# Patient Record
Sex: Male | Born: 1950 | Race: White | Hispanic: No | Marital: Married
Health system: Southern US, Community
[De-identification: ages and names within clinical notes are randomized; demographics above are authoritative.]

## PROBLEM LIST (undated history)

## (undated) DIAGNOSIS — M1990 Unspecified osteoarthritis, unspecified site: Secondary | ICD-10-CM

## (undated) DIAGNOSIS — Q249 Congenital malformation of heart, unspecified: Secondary | ICD-10-CM

## (undated) DIAGNOSIS — K219 Gastro-esophageal reflux disease without esophagitis: Secondary | ICD-10-CM

## (undated) DIAGNOSIS — I10 Essential (primary) hypertension: Secondary | ICD-10-CM

## (undated) HISTORY — DX: Gastro-esophageal reflux disease without esophagitis: K21.9

## (undated) HISTORY — DX: Congenital malformation of heart, unspecified: Q24.9

## (undated) HISTORY — DX: Unspecified osteoarthritis, unspecified site: M19.90

## (undated) HISTORY — DX: Essential (primary) hypertension: I10

---

## 2003-12-12 HISTORY — PX: KNEE SURGERY: SHX244

## 2007-12-12 HISTORY — PX: SHOULDER SURGERY: SHX246

## 2013-12-11 HISTORY — PX: PROSTATE SURGERY: SHX751

## 2018-10-11 NOTE — Progress Notes (Signed)
[facility] [address] [place], [place] [address] Phone: [phone] Subjective:    I Jay Morse am serving as a Neurosurgeon for Dr. Antoine Morse.   CC: Right knee pain  BJY:[id number]  Jay Morse is a 67 y.o. male coming in with complaint of right knee pain. No numbness and tingling noted. Pain radiates to the mid calf. History of right knee surgery 2005.  Onset- 20 years  Location- lateral Duration-  Character- Achy Aggravating factors- Tennis Reliving factors- Ice, heat, topicals, oral Therapies tried-  Severity-7 out of 10     Past Medical History:  Diagnosis Date  . Arthritis   . 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 GERD (gastroesophageal reflux disease)   . High blood pressure    Past Surgical History:  Procedure Laterality Date  . KNEE SURGERY Right 2005  . PROSTATE SURGERY  2015  . SHOULDER SURGERY Right 2009   Social History   Socioeconomic History  . Marital status: Married    Spouse name: Not on file  . Number of children: 2  . Years of education: Not on file  . Highest education level: Not on file  Occupational History  . Not on file  Social Needs  . Financial resource strain: Not on file  . Food insecurity:    Worry: Not on file    Inability: Not on file  . Transportation needs:    Medical: Not on file    Non-medical: Not on file  Tobacco Use  . Smoking status: Not on file  Substance and Sexual Activity  . Alcohol use: Not on file  . Drug use: Not on file  . Sexual activity: Yes  Lifestyle  . Physical activity:    Days per week: Not on file    Minutes per session: Not on file  . Stress: Not on file  Relationships  . Social connections:    Talks on phone: Not on file    Gets together: Not on file    Attends religious service: Not on file    Active member of club or organization: Not on file    Attends meetings of clubs or organizations: Not on file    Relationship status: Not on  file  Other Topics Concern  . Not on file  Social History Narrative  . Not on file   Not on File Family History  Problem Relation Age of Onset  . Hearing loss Mother   . Hypertension Mother   . Diabetes Father   . Hearing loss Father   . Hypertension Father      Current Outpatient Medications (Cardiovascular):  .  metoprolol tartrate (LOPRESSOR) 50 MG tablet, Take 50 mg by mouth daily.     Current Outpatient Medications (Other):  .  ranitidine (ZANTAC) 300 MG tablet, Take 300 mg by mouth at bedtime. .  Vitamin D, Ergocalciferol, (DRISDOL) 50000 units CAPS capsule, Take 1 capsule (50,000 Units total) by mouth every 7 (seven) days.    Past medical history, social, surgical and family history all reviewed in electronic medical record.  No pertanent information unless stated regarding to the chief complaint.   Review of Systems:  No headache, visual changes, nausea, vomiting, diarrhea, constipation, dizziness, abdominal pain, skin rash, fevers, chills, night sweats, weight loss, swollen lymph nodes, body aches, joint swelling, chest pain, shortness of breath, mood changes.  Positive muscle aches  Objective  Blood pressure 110/82, pulse (!) 56, height 6\' 4"  (  1.93 m), weight 233 lb (105.7 kg), SpO2 93 %.    General: No apparent distress alert and oriented x3 mood and affect normal, dressed appropriately.  HEENT: Pupils equal, extraocular movements intact  Respiratory: Patient's speak in full sentences and does not appear short of breath  Cardiovascular: No lower extremity edema, non tender, no erythema  Skin: Warm dry intact with no signs of infection or rash on extremities or on axial skeleton.  Abdomen: Soft nontender  Neuro: Cranial nerves II through XII are intact, neurovascularly intact in all extremities with 2+ DTRs and 2+ pulses.  Lymph: No lymphadenopathy of posterior or anterior cervical chain or axillae bilaterally.  Gait antalgic.  MSK:  Non tender with full range  of motion and good stability and symmetric strength and tone of shoulders, elbows, wrist, hip, and ankles bilaterally.  Knee: Right Varus deformity noted.  Normal thigh to calf ratio.  Tender to palpation over lateral and PF joint line.  ROM full in flexion and extension and lower leg rotation. instability with varus force.  painful patellar compression. Patellar glide with moderate crepitus. Patellar and quadriceps tendons unremarkable. Hamstring and quadriceps strength is normal. Contralateral knee shows mild bowing but no pain  Back Exam:  Inspection: Severe loss of lordosis Motion: Flexion 45 deg, Extension 25 deg, Side Bending to 35 deg bilaterally,  Rotation to 35 deg bilaterally  SLR laying: Negative  XSLR laying: Negative  Palpable tenderness: Tender to palpation.Marland Kitchen FABER: Tightness bilaterally. Sensory change: Gross sensation intact to all lumbar and sacral dermatomes.  Reflexes: 2+ at both patellar tendons, 2+ at achilles tendons, Babinski's downgoing.  Strength at foot  Plantar-flexion: 5/5 Dorsi-flexion: 5/5 Eversion: 5/5 Inversion: 5/5  Leg strength  Quad: 5/5 Hamstring: 5/5 Hip flexor: 5/5 Hip abductors: 4/5 but symmetric  MSK US performed of: Knee This study was ordered, performed, and interpreted by Jay Morse D.O.  Knee: Postsurgical changes of the lateral meniscus noted with moderate to severe osteoarthritic changes.  Patient does have injury of the LCL.  IMPRESSION: Lateral compartment arthritis   [address]10; 15 additional minutes spent for Therapeutic exercises as stated in above notes.  This included exercises focusing on stretching, strengthening, with significant focus on eccentric aspects.   Long term goals include an improvement in range of motion, strength, endurance as well as avoiding reinjury. Patient's frequency would include in 1-2 times a day, 3-5 times a week for a duration of 6-12 weeks. Low back exercises that included:  Pelvic tilt/bracing  instruction to focus on control of the pelvic girdle and lower abdominal muscles  Glute strengthening exercises, focusing on proper firing of the glutes without engaging the low back muscles Proper stretching techniques for maximum relief for the hamstrings, hip flexors, low back and some rotation where tolerated    Proper technique shown and discussed handout in great detail with ATC.  All questions were discussed and answered.     Impression and Recommendations:     This case required medical decision making of moderate complexity. The above documentation has been reviewed and is accurate and complete Jay Saa, DO       Note: This dictation was prepared with Dragon dictation along with smaller phrase technology. Any transcriptional errors that result from this process are unintentional.

## 2018-10-14 ENCOUNTER — Encounter: Payer: Self-pay | Admitting: Family Medicine

## 2018-10-14 ENCOUNTER — Ambulatory Visit (INDEPENDENT_AMBULATORY_CARE_PROVIDER_SITE_OTHER)
Admission: RE | Admit: 2018-10-14 | Discharge: 2018-10-14 | Disposition: A | Discharge status: Home / Self Care | Payer: Medicare Other | Source: Ambulatory Visit | Attending: Family Medicine | Admitting: Family Medicine

## 2018-10-14 ENCOUNTER — Ambulatory Visit (INDEPENDENT_AMBULATORY_CARE_PROVIDER_SITE_OTHER): Payer: Medicare Other | Admitting: Family Medicine

## 2018-10-14 ENCOUNTER — Ambulatory Visit: Payer: Self-pay

## 2018-10-14 VITALS — BP 110/82 | HR 56 | Ht 76.0 in | Wt 233.0 lb

## 2018-10-14 DIAGNOSIS — M13861 Other specified arthritis, right knee: Secondary | ICD-10-CM | POA: Diagnosis not present

## 2018-10-14 DIAGNOSIS — M25561 Pain in right knee: Principal | ICD-10-CM

## 2018-10-14 DIAGNOSIS — M549 Dorsalgia, unspecified: Secondary | ICD-10-CM

## 2018-10-14 DIAGNOSIS — M48061 Spinal stenosis, lumbar region without neurogenic claudication: Secondary | ICD-10-CM | POA: Diagnosis not present

## 2018-10-14 DIAGNOSIS — G8929 Other chronic pain: Secondary | ICD-10-CM

## 2018-10-14 MED ORDER — VITAMIN D (ERGOCALCIFEROL) 1.25 MG (50000 UNIT) PO CAPS: 50000.0000 [IU] | ORAL_CAPSULE | ORAL | 0 refills | Status: DC

## 2018-10-14 NOTE — Patient Instructions (Addendum)
Good to meet you  Ice 20 minutes 2 times daily. Usually after activity and before bed. pennsaid pinkie amount topically 2 times daily as needed.  Exercises 3 times a week.  Once weekly vitamin D for 12 weeks Xrays downstairs Turmeric 500mg  daily  Fish oil 2 grams daily  They will call you on the brace See me again in 4-6weeks and we will make sure making progress

## 2018-10-14 NOTE — Assessment & Plan Note (Signed)
Degenerative spinal stenosis.  Discussed icing regimen and home exercises.  Discussed which activities to do

## 2018-10-14 NOTE — Assessment & Plan Note (Signed)
Lateral compartment.  We will get an unloader brace with patient having fairly specific arthritis.  Seems to be doing better with some mild instability and abnormal thigh to calf ratio.  Custom bracing will be necessary.  Discussed icing regimen and home exercise.  Discussed which activities doing which wants to avoid.  Patient will try topical anti-inflammatories.  Follow-up again in 4 to 6 weeks

## 2018-11-25 NOTE — Progress Notes (Signed)
Tawana ScaleZach Morse D.O. [facility] [address] [place], [place] [address] Phone: [phone] Subjective:   Jay Morse, Jay Morse, am serving as a scribe for Dr. Antoine PrimasZachary Morse.    CC: Knee and back pain  NFA:[id number]:Subjective  Jay SpikesJohn Morse is a 67 y.o. male coming in with complaint of knee and back pain. Patient has brace and has played tennis twice while wearing the brace. More stability patient has been using the brace more regularly feels like it is doing well.  Known arthritic changes  Patient has been traveling but began yoga and feels that his pain is decreasing.overall feeling decent. Noting severe.  Patient feels tightness overall but nothing severe.      Past Medical History:  Diagnosis Date  . Arthritis   . 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 GERD (gastroesophageal reflux disease)   . High blood pressure    Past Surgical History:  Procedure Laterality Date  . KNEE SURGERY Right 2005  . PROSTATE SURGERY  2015  . SHOULDER SURGERY Right 2009   Social History   Socioeconomic History  . Marital status: Married    Spouse name: Not on file  . Number of children: 2  . Years of education: Not on file  . Highest education level: Not on file  Occupational History  . Not on file  Social Needs  . Financial resource strain: Not on file  . Food insecurity:    Worry: Not on file    Inability: Not on file  . Transportation needs:    Medical: Not on file    Non-medical: Not on file  Tobacco Use  . Smoking status: Not on file  Substance and Sexual Activity  . Alcohol use: Not on file  . Drug use: Not on file  . Sexual activity: Yes  Lifestyle  . Physical activity:    Days per week: Not on file    Minutes per session: Not on file  . Stress: Not on file  Relationships  . Social connections:    Talks on phone: Not on file    Gets together: Not on file    Attends religious service: Not on file    Active member of club or organization: Not on file   Attends meetings of clubs or organizations: Not on file    Relationship status: Not on file  Other Topics Concern  . Not on file  Social History Narrative  . Not on file   Not on File Family History  Problem Relation Age of Onset  . Hearing loss Mother   . Hypertension Mother   . Diabetes Father   . Hearing loss Father   . Hypertension Father      Current Outpatient Medications (Cardiovascular):  .  metoprolol tartrate (LOPRESSOR) 50 MG tablet, Take 50 mg by mouth daily.     Current Outpatient Medications (Other):  .  ranitidine (ZANTAC) 300 MG tablet, Take 300 mg by mouth at bedtime. .  Vitamin D, Ergocalciferol, (DRISDOL) 50000 units CAPS capsule, Take 1 capsule (50,000 Units total) by mouth every 7 (seven) days.    Past medical history, social, surgical and family history all reviewed in electronic medical record.  No pertanent information unless stated regarding to the chief complaint.   Review of Systems:  No headache, visual changes, nausea, vomiting, diarrhea, constipation, dizziness, abdominal pain, skin rash, fevers, chills, night sweats, weight loss, swollen lymph nodes, body aches, joint swelling, muscle aches, chest pain, shortness of breath,  mood changes.   Objective  There were no vitals taken for this visit. Systems examined below as of    General: No apparent distress alert and oriented x3 mood and affect normal, dressed appropriately.  HEENT: Pupils equal, extraocular movements intact  Respiratory: Patient's speak in full sentences and does not appear short of breath  Cardiovascular: No lower extremity edema, non tender, no erythema  Skin: Warm dry intact with no signs of infection or rash on extremities or on axial skeleton.  Abdomen: Soft nontender  Neuro: Cranial nerves II through XII are intact, neurovascularly intact in all extremities with 2+ DTRs and 2+ pulses.  Lymph: No lymphadenopathy of posterior or anterior cervical chain or axillae  bilaterally.  Gait normal with good balance and coordination.  MSK:  Non tender with full range of motion and good stability and symmetric strength and tone of shoulders, elbows, wrist, hip, and ankles bilaterally.  Knee: Right Varus deformity noted. Large thigh to calf ratio.  Tender to palpation over medial and PF joint line.  ROM full in flexion and extension and lower leg rotation. instability with valgus force.  painful patellar compression. Patellar glide with moderate crepitus. Patellar and quadriceps tendons unremarkable. Hamstring and quadriceps strength is normal. Contralateral knee shows mild arthritic changes  Patient's back exam has loss of lordosis.  Patient does have significant tightness with straight leg test but no radicular symptoms.  Positive Faber test on the right side.  Near full range of motion but lacks last 10 degrees of extension.  Patient did have x-rays of the lumbar spine that were independently visualized by me.  X-rays do show moderate to severe degenerative disc disease at L4-L5.   Impression and Recommendations:     This case required medical decision making of moderate complexity. The above documentation has been reviewed and is accurate and complete Jay Saa, DO       Note: This dictation was prepared with Dragon dictation along with smaller phrase technology. Any transcriptional errors that result from this process are unintentional.

## 2018-11-26 ENCOUNTER — Ambulatory Visit (INDEPENDENT_AMBULATORY_CARE_PROVIDER_SITE_OTHER): Payer: Medicare Other | Admitting: Family Medicine

## 2018-11-26 ENCOUNTER — Encounter: Payer: Self-pay | Admitting: Family Medicine

## 2018-11-26 DIAGNOSIS — M13861 Other specified arthritis, right knee: Secondary | ICD-10-CM | POA: Diagnosis not present

## 2018-11-26 DIAGNOSIS — M48061 Spinal stenosis, lumbar region without neurogenic claudication: Secondary | ICD-10-CM | POA: Diagnosis not present

## 2018-11-26 MED ORDER — PREDNISONE 50 MG PO TABS: 50.0000 mg | ORAL_TABLET | Freq: Every day | ORAL | 0 refills | Status: DC

## 2018-11-26 NOTE — Assessment & Plan Note (Signed)
Doing well.  No changes in management.  Continue the bracing.

## 2018-11-26 NOTE — Assessment & Plan Note (Signed)
Degenerative spinal stenosis.  Discussed with patient in great length.  Discussed posture and ergonomics.  We discussed that as long as patient does well we will make no significant other changes.  Patient has been fairly noncompliant with medications.  Worsening symptoms consider advanced imaging.

## 2018-11-26 NOTE — Patient Instructions (Addendum)
Good to see you  Ice is your friend Stay active Exercises 3 times a week.  Continue the brace Prednisone sent in so you have it just in case No other big changes Happy holidays!  See me again in 4 months!

## 2019-03-31 ENCOUNTER — Ambulatory Visit: Payer: Medicare Other | Admitting: Family Medicine

## 2019-11-09 NOTE — Progress Notes (Signed)
Jay Morse Andreus Morse D.O. [facility] [address] [place], [place] [address] Phone: [phone] Subjective:   I Jay NighKana Morse am serving as a Neurosurgeonscribe for Jay Morse.  This visit occurred during the SARS-CoV-2 public health emergency.  Safety protocols were in place, including screening questions prior to the visit, additional usage of staff PPE, and extensive cleaning of exam room while observing appropriate contact time as indicated for disinfecting solutions.     CC:   Low back pain, knee pain follow-up  BJY:NWGNFAOZHYHPI:Subjective  Jay SpikesJohn Morse is a 68 y.o. male coming in with complaint of right knee and left hip pain. Left hip pain is worse. Pain keeps him up at night. Knee is painful with activity. Patient believes it is GT bursitis. Patient states the left leg goes numb. Sometimes his left ankle is painful due to the hip pain.   Onset- Chronic  Location - lateral knee and hip pain  Character- achy  Aggravating factors- sitting, certain movements, sleeping  Reliving factors-  Therapies tried- pennsaid, ibuprofen (1 dose per week) Severity- knee 3/10 hip 7-8/10 at its worse    Known history of degenerative spinal stenosis last x-rays were November 2019  Past Medical History:  Diagnosis Date  . Arthritis   . 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 GERD (gastroesophageal reflux disease)   . High blood pressure    Past Surgical History:  Procedure Laterality Date  . KNEE SURGERY Right 2005  . PROSTATE SURGERY  2015  . SHOULDER SURGERY Right 2009   Social History   Socioeconomic History  . Marital status: Married    Spouse name: Not on file  . Number of children: 2  . Years of education: Not on file  . Highest education level: Not on file  Occupational History  . Not on file  Social Needs  . Financial resource strain: Not on file  . Food insecurity    Worry: Not on file    Inability: Not on file  . Transportation needs    Medical: Not on file   Non-medical: Not on file  Tobacco Use  . Smoking status: Not on file  Substance and Sexual Activity  . Alcohol use: Not on file  . Drug use: Not on file  . Sexual activity: Yes  Lifestyle  . Physical activity    Days per week: Not on file    Minutes per session: Not on file  . Stress: Not on file  Relationships  . Social Musicianconnections    Talks on phone: Not on file    Gets together: Not on file    Attends religious service: Not on file    Active member of club or organization: Not on file    Attends meetings of clubs or organizations: Not on file    Relationship status: Not on file  Other Topics Concern  . Not on file  Social History Narrative  . Not on file   Not on File Family History  Problem Relation Age of Onset  . Hearing loss Mother   . Hypertension Mother   . Diabetes Father   . Hearing loss Father   . Hypertension Father     Current Outpatient Medications (Endocrine & Metabolic):  .  predniSONE (DELTASONE) 50 MG tablet, Take 1 tablet (50 mg total) by mouth daily.  Current Outpatient Medications (Cardiovascular):  .  chlorthalidone (HYGROTON) 25 MG tablet, Take 12 mg by mouth daily. 12 mg daily .  flecainide (  TAMBOCOR) 50 MG tablet, Take 50 mg by mouth 2 (two) times daily. .  metoprolol tartrate (LOPRESSOR) 50 MG tablet, Take 50 mg by mouth daily. Marland Kitchen  olmesartan (BENICAR) 40 MG tablet, Take 40 mg by mouth daily.     Current Outpatient Medications (Other):  .  cholecalciferol (VITAMIN D) 1000 units tablet, Take 5,000 Units by mouth daily. .  ranitidine (ZANTAC) 300 MG tablet, Take 300 mg by mouth at bedtime. .  gabapentin (NEURONTIN) 100 MG capsule, Take 2 capsules (200 mg total) by mouth at bedtime.    Past medical history, social, surgical and family history all reviewed in electronic medical record.  No pertanent information unless stated regarding to the chief complaint.   Review of Systems:  No headache, visual changes, nausea, vomiting, diarrhea,  constipation, dizziness, abdominal pain, skin rash, fevers, chills, night sweats, weight loss, swollen lymph nodes, body aches, joint swelling,  chest pain, shortness of breath, mood changes.  Positive muscle aches  Objective  Blood pressure 100/80, pulse 62, height 6\' 4"  (1.93 m), weight 233 lb (105.7 kg), SpO2 92 %.    General: No apparent distress alert and oriented x3 mood and affect normal, dressed appropriately.  HEENT: Pupils equal, extraocular movements intact  Respiratory: Patient's speak in full sentences and does not appear short of breath  Cardiovascular: No lower extremity edema, non tender, no erythema  Skin: Warm dry intact with no signs of infection or rash on extremities or on axial skeleton.  Abdomen: Soft nontender  Neuro: Cranial nerves II through XII are intact, neurovascularly intact in all extremities with 2+ DTRs and 2+ pulses.  Lymph: No lymphadenopathy of posterior or anterior cervical chain or axillae bilaterally.  Gait normal with good balance and coordination.  MSK:  Non tender with full range of motion and good stability and symmetric strength and tone of shoulders, elbows, wrist, and ankles bilaterally.  Back Exam:  Inspection: Mild loss of lordosis Motion: Flexion 45 deg, Extension 25 deg, Side Bending to 35 deg bilaterally,  Rotation to 45 deg bilaterally  SLR laying: Mild radicular symptoms in the left side XSLR laying: Negative  Palpable tenderness: Tender to palpation paraspinal musculature lumbar spine is. FABER: Positive. Sensory change: Gross sensation intact to all lumbar and sacral dermatomes.  Reflexes: 2+ at both patellar tendons, 2+ at achilles tendons, Babinski's downgoing.  Strength at foot  Plantar-flexion: 5/5 Dorsi-flexion: 5/5 Eversion: 5/5 Inversion: 5/5  Leg strength  Quad: 5/5 Hamstring: 5/5 Hip flexor: 5/5 Hip abductors: 5/5   Right knee exam shows the patient does have some tenderness to palpation over the patellofemoral with  positive patellar grind test.  No significant instability but does have an abnormal thigh to calf ratio.  Lacks last 5 degrees of flexion the last 2 degrees of extension    Procedure: Real-time Ultrasound Guided Injection of left  greater trochanteric bursitis secondary to patient's body habitus Device: GE Logiq Q7  Ultrasound guided injection is preferred based studies that show increased duration, increased effect, greater accuracy, decreased procedural pain, increased response rate, and decreased cost with ultrasound guided versus blind injection.  Verbal informed consent obtained.  Time-out conducted.  Noted no overlying erythema, induration, or other signs of local infection.  Skin prepped in a sterile fashion.  Local anesthesia: Topical Ethyl chloride.  With sterile technique and under real time ultrasound guidance:  Greater trochanteric area was visualized and patient's bursa was noted. A 22-gauge 3 inch needle was inserted and 4 cc of 0.5% Marcaine and  1 cc of Kenalog 40 mg/dL was injected. Pictures taken Completed without difficulty  Pain immediately resolved suggesting accurate placement of the medication.  Advised to call if fevers/chills, erythema, induration, drainage, or persistent bleeding.  Images permanently stored and available for review in the ultrasound unit.  Impression: Technically successful ultrasound guided injection.   After informed written and verbal consent, patient was seated on exam table. Right knee was prepped with alcohol swab and utilizing anterolateral approach, patient's right knee space was injected with 4:1  marcaine 0.5%: Kenalog 40mg /dL. Patient tolerated the procedure well without immediate complications.   Impression and Recommendations:     This case required medical decision making of moderate complexity. The above documentation has been reviewed and is accurate and complete , DO       Note: This dictation was prepared with  Dragon dictation along with smaller phrase technology. Any transcriptional errors that result from this process are unintentional.

## 2019-11-10 ENCOUNTER — Ambulatory Visit: Payer: Self-pay

## 2019-11-10 ENCOUNTER — Encounter: Payer: Self-pay | Admitting: Family Medicine

## 2019-11-10 ENCOUNTER — Ambulatory Visit (INDEPENDENT_AMBULATORY_CARE_PROVIDER_SITE_OTHER): Payer: Medicare Other | Admitting: Family Medicine

## 2019-11-10 ENCOUNTER — Other Ambulatory Visit: Payer: Self-pay

## 2019-11-10 VITALS — BP 100/80 | HR 62 | Ht 76.0 in | Wt 233.0 lb

## 2019-11-10 DIAGNOSIS — M48061 Spinal stenosis, lumbar region without neurogenic claudication: Secondary | ICD-10-CM

## 2019-11-10 DIAGNOSIS — M13861 Other specified arthritis, right knee: Secondary | ICD-10-CM | POA: Diagnosis not present

## 2019-11-10 DIAGNOSIS — M7062 Trochanteric bursitis, left hip: Secondary | ICD-10-CM | POA: Insufficient documentation

## 2019-11-10 DIAGNOSIS — M25552 Pain in left hip: Secondary | ICD-10-CM

## 2019-11-10 MED ORDER — GABAPENTIN 100 MG PO CAPS: 200.0000 mg | ORAL_CAPSULE | Freq: Every day | ORAL | 3 refills | Status: AC

## 2019-11-10 NOTE — Assessment & Plan Note (Signed)
Patient given injection today and tolerated the procedure well.  We discussed the possibility of home exercises icing regimen.  Discussed that he could be a candidate for viscosupplementation.  X-rays do show moderate to severe narrowing of the patellofemoral joint.

## 2019-11-10 NOTE — Assessment & Plan Note (Signed)
Patient given injection and tolerated the procedure well.  We discussed icing regimen and home exercise, discussed which activities to do which wants to avoid.  Patient is to increase activity slowly over the course the next several weeks.  Patient will follow up with me again in 6 weeks if not better and further evaluation of patient's lumbar spine.

## 2019-11-10 NOTE — Patient Instructions (Signed)
Good to see you Restart exercises In 10 days if no better call me for mri See me again when you are back from [place]

## 2019-11-10 NOTE — Assessment & Plan Note (Signed)
Discussed with patient in great length.  We discussed that this does seem to be more of the back.  Does have known degenerative disc disease at L4-L5 likely contributing to spinal stenosis in the L5 corresponding aspect does give patient significant amount of discomfort and pain.  Discussed with patient icing regimen, home exercise, which activities to do which wants to avoid.  Patient has been fairly noncompliant with conservative therapy previously.  We discussed otherwise secondary to the pain advanced imaging would be warranted with the possibility of epidurals.

## 2019-11-17 ENCOUNTER — Encounter: Payer: Self-pay | Admitting: Family Medicine

## 2020-02-23 ENCOUNTER — Encounter: Payer: Self-pay | Admitting: Family Medicine

## 2020-02-24 MED ORDER — CELECOXIB 200 MG PO CAPS: ORAL_CAPSULE | ORAL | 2 refills | Status: DC

## 2020-04-17 ENCOUNTER — Encounter: Payer: Self-pay | Admitting: Family Medicine

## 2020-04-22 ENCOUNTER — Ambulatory Visit: Payer: Self-pay

## 2020-04-22 ENCOUNTER — Other Ambulatory Visit: Payer: Self-pay

## 2020-04-22 ENCOUNTER — Ambulatory Visit (INDEPENDENT_AMBULATORY_CARE_PROVIDER_SITE_OTHER): Payer: Medicare Other | Admitting: Family Medicine

## 2020-04-22 VITALS — BP 124/84 | HR 70 | Ht 76.0 in

## 2020-04-22 DIAGNOSIS — M25561 Pain in right knee: Secondary | ICD-10-CM

## 2020-04-22 DIAGNOSIS — M13861 Other specified arthritis, right knee: Secondary | ICD-10-CM

## 2020-04-22 MED ORDER — ALLOPURINOL 100 MG PO TABS: 200.0000 mg | ORAL_TABLET | Freq: Every day | ORAL | 3 refills | Status: AC

## 2020-04-22 NOTE — Patient Instructions (Signed)
Good to see you  Ice 20 minutes 2 times daily. Usually after activity and before bed. allopurinol 200mg  daily  Tart cherry extract 1200mg  at night  Knee compression sleeve ([email or web address]) Exercises 3 times a week.  Injected knee today  Popliteal tendonitis.  See me again in 4-6 weeks to make sure doing well

## 2020-04-22 NOTE — Progress Notes (Signed)
[name] Phone: [phone] Subjective:    I'm seeing this patient by the request  of:  Patient, No Pcp Per  CC: Right knee pain  [id number]    11/10/2019 Patient given injection and tolerated the procedure well.  We discussed icing regimen and home exercise, discussed which activities to do which wants to avoid.  Patient is to increase activity slowly over the course the next several weeks.  Patient will follow up with me again in 6 weeks if not better and further evaluation of patient's lumbar spine.  Update 04/22/2020 Jay Morse is a 69 y.o. male coming in with complaint of right knee pain. Having a hard time sit to stand. Has had meniscal surgery, Synvisc injections. Does continue to play tennis. Pain has increased past 2 months. Pain over lateral aspect of knee. Pain is 7/10 at it's worst but is 1/10 right now. Pain increases with driving. Is being woken up at night in pain.   Has discontinued gabapentin as the injection took away his pain.        Past Medical History:  Diagnosis Date  . Arthritis   . 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 GERD (gastroesophageal reflux disease)   . High blood pressure    Past Surgical History:  Procedure Laterality Date  . KNEE SURGERY Right 2005  . PROSTATE SURGERY  2015  . SHOULDER SURGERY Right 2009   Social History   Socioeconomic History  . Marital status: Married    Spouse name: Not on file  . Number of children: 2  . Years of education: Not on file  . Highest education level: Not on file  Occupational History  . Not on file  Tobacco Use  . Smoking status: Not on file  Substance and Sexual Activity  . Alcohol use: Not on file  . Drug use: Not on file  . Sexual activity: Yes  Other Topics Concern  . Not on file  Social History Narrative  . Not on file   Social Determinants of Health   Financial Resource Strain:   .  Difficulty of Paying Living Expenses:   Food Insecurity:   . Worried About Charity fundraiser in the Last Year:   . Arboriculturist in the Last Year:   Transportation Needs:   . Film/video editor (Medical):   Marland Kitchen Lack of Transportation (Non-Medical):   Physical Activity:   . Days of Exercise per Week:   . Minutes of Exercise per Session:   Stress:   . Feeling of Stress :   Social Connections:   . Frequency of Communication with Friends and Family:   . Frequency of Social Gatherings with Friends and Family:   . Attends Religious Services:   . Active Member of Clubs or Organizations:   . Attends Archivist Meetings:   Marland Kitchen Marital Status:    Not on File Family History  Problem Relation Age of Onset  . Hearing loss Mother   . Hypertension Mother   . Diabetes Father   . Hearing loss Father   . Hypertension Father     Current Outpatient Medications (Endocrine & Metabolic):  .  predniSONE (DELTASONE) 50 MG tablet, Take 1 tablet (50 mg total) by mouth daily.  Current Outpatient Medications (Cardiovascular):  .  chlorthalidone (HYGROTON) 25 MG tablet, Take 12 mg by mouth daily. 12 mg daily .  flecainide (TAMBOCOR) 50  MG tablet, Take 50 mg by mouth 2 (two) times daily. .  metoprolol tartrate (LOPRESSOR) 50 MG tablet, Take 50 mg by mouth daily. Marland Kitchen  olmesartan (BENICAR) 40 MG tablet, Take 40 mg by mouth daily.   Current Outpatient Medications (Analgesics):  .  celecoxib (CELEBREX) 200 MG capsule, One to 2 tablets by mouth daily as needed for pain.   Current Outpatient Medications (Other):  .  cholecalciferol (VITAMIN D) 1000 units tablet, Take 5,000 Units by mouth daily. Marland Kitchen  gabapentin (NEURONTIN) 100 MG capsule, Take 2 capsules (200 mg total) by mouth at bedtime. .  ranitidine (ZANTAC) 300 MG tablet, Take 300 mg by mouth at bedtime.   Reviewed prior external information including notes and imaging from  primary care provider As well as notes that were available from  care everywhere and other healthcare systems.  Past medical history, social, surgical and family history all reviewed in electronic medical record.  No pertanent information unless stated regarding to the chief complaint.   Review of Systems:  No headache, visual changes, nausea, vomiting, diarrhea, constipation, dizziness, abdominal pain, skin rash, fevers, chills, night sweats, weight loss, swollen lymph nodes, body aches, joint swelling, chest pain, shortness of breath, mood changes. POSITIVE muscle aches  Objective  There were no vitals taken for this visit.   General: No apparent distress alert and oriented x3 mood and affect normal, dressed appropriately.  HEENT: Pupils equal, extraocular movements intact  Respiratory: Patient's speak in full sentences and does not appear short of breath  Cardiovascular: No lower extremity edema, non tender, no erythema  Neuro: Cranial nerves II through XII are intact, neurovascularly intact in all extremities with 2+ DTRs and 2+ pulses.  Gait antalgic gait noted MSK:  Non tender with full range of motion and good stability and symmetric strength and tone of shoulders, elbows, wrist, hip, and ankles bilaterally.  Right knee exam still shows arthritic changes.  Lacks the last 2 degrees of extension in the last 5 degrees of flexion.  Patient does have some instability with valgus and varus force.  Positive patellar grind test noted.  Tenderness over the popliteal fossa  Limited musculoskeletal ultrasound was performed and interpreted by Judi Saa  Limited ultrasound of patient's knee shows that patient does have a popliteal tendinitis noted.  Moderate arthritic changes of the knee still noted.  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  Questionable uric acid deposits noted.   Impression and Recommendations:     This case required medical decision making of moderate complexity. The above  documentation has been reviewed and is accurate and complete Judi Saa, DO       Note: This dictation was prepared with Dragon dictation along with smaller phrase technology. Any transcriptional errors that result from this process are unintentional.

## 2020-04-23 ENCOUNTER — Encounter: Payer: Self-pay | Admitting: Family Medicine

## 2020-04-23 NOTE — Assessment & Plan Note (Signed)
Repeat injection given today.  Tolerated the procedure well, discussed icing regimen and home exercises, discussed which activities to doing which wants to avoid.  Patient is to increase activity slowly.  I do believe some underlying uric acid deposits could be contributing and started on allopurinol.  Possible popliteal tendinitis the neck exercises and compression sleeve also given.  Increase activity slowly.  Follow-up again in 4 to 8 weeks

## 2020-05-05 ENCOUNTER — Encounter: Payer: Self-pay | Admitting: Family Medicine

## 2020-05-27 ENCOUNTER — Other Ambulatory Visit: Payer: Self-pay

## 2020-05-27 ENCOUNTER — Ambulatory Visit (INDEPENDENT_AMBULATORY_CARE_PROVIDER_SITE_OTHER): Payer: Medicare Other | Admitting: Family Medicine

## 2020-05-27 ENCOUNTER — Encounter: Payer: Self-pay | Admitting: Family Medicine

## 2020-05-27 DIAGNOSIS — M13861 Other specified arthritis, right knee: Secondary | ICD-10-CM

## 2020-05-27 NOTE — Assessment & Plan Note (Signed)
Patient is doing remarkably well at this time.  Did need a repeat in 6 months.  We discussed at this point with him doing well continue the same regimen.  We discussed the potential discontinuation of allopurinol in the near future as well as the physical therapy.  Patient is going to have a follow-up appointment in 3 months for further evaluation.

## 2020-05-27 NOTE — Progress Notes (Signed)
[name] [address] [place] [address] Phone: [phone] Subjective:   Jay Morse, am serving as a scribe for Dr. Antoine Primas. This visit occurred during the SARS-CoV-2 public health emergency.  Safety protocols were in place, including screening questions prior to the visit, additional usage of staff PPE, and extensive cleaning of exam room while observing appropriate contact time as indicated for disinfecting solutions.   I'm seeing this patient by the request  of:  Patient, No Pcp Per  CC: Knee pain follow-up  [id number]   04/22/2020 Repeat injection given today.  Tolerated the procedure well, discussed icing regimen and home exercises, discussed which activities to doing which wants to avoid.  Patient is to increase activity slowly.  I do believe some underlying uric acid deposits could be contributing and started on allopurinol.  Possible popliteal tendinitis the neck exercises and compression sleeve also given.  Increase activity slowly.  Follow-up again in 4 to 8 weeks  Update 05/27/2020 Jay Morse is a 69 y.o. male coming in with complaint of right knee pain. Has started PT, allopurinol. Feels much better since last visit.  Patient would actually state about 80% better.  Left GT pain has also lessened. Is able to walk now about 2 miles. Has not had any pain at night either. Most painful activity is sitting in car for prolonged periods. Pain is 4/10 today from traveling 2 hours to get here. Taking celebrex prn. Does not feel that it helped the more severe pain.       Past Medical History:  Diagnosis Date  . Arthritis   . 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 GERD (gastroesophageal reflux disease)   . High blood pressure    Past Surgical History:  Procedure Laterality Date  . KNEE SURGERY Right 2005  . PROSTATE SURGERY  2015  . SHOULDER SURGERY Right 2009   Social History   Socioeconomic History  .  Marital status: Married    Spouse name: Not on file  . Number of children: 2  . Years of education: Not on file  . Highest education level: Not on file  Occupational History  . Not on file  Tobacco Use  . Smoking status: Not on file  Substance and Sexual Activity  . Alcohol use: Not on file  . Drug use: Not on file  . Sexual activity: Yes  Other Topics Concern  . Not on file  Social History Narrative  . Not on file   Social Determinants of Health   Financial Resource Strain:   . Difficulty of Paying Living Expenses:   Food Insecurity:   . Worried About Programme researcher, broadcasting/film/video in the Last Year:   . Barista in the Last Year:   Transportation Needs:   . Freight forwarder (Medical):   Marland Kitchen Lack of Transportation (Non-Medical):   Physical Activity:   . Days of Exercise per Week:   . Minutes of Exercise per Session:   Stress:   . Feeling of Stress :   Social Connections:   . Frequency of Communication with Friends and Family:   . Frequency of Social Gatherings with Friends and Family:   . Attends Religious Services:   . Active Member of Clubs or Organizations:   . Attends Banker Meetings:   Marland Kitchen Marital Status:    Not on File Family History  Problem Relation Age of Onset  . Hearing loss Mother   .  Hypertension Mother   . Diabetes Father   . Hearing loss Father   . Hypertension Father     Current Outpatient Medications (Endocrine & Metabolic):  .  predniSONE (DELTASONE) 50 MG tablet, Take 1 tablet (50 mg total) by mouth daily.  Current Outpatient Medications (Cardiovascular):  .  chlorthalidone (HYGROTON) 25 MG tablet, Take 12 mg by mouth daily. 12 mg daily .  flecainide (TAMBOCOR) 50 MG tablet, Take 50 mg by mouth 2 (two) times daily. .  metoprolol tartrate (LOPRESSOR) 50 MG tablet, Take 50 mg by mouth daily. Marland Kitchen  olmesartan (BENICAR) 40 MG tablet, Take 40 mg by mouth daily.   Current Outpatient Medications (Analgesics):  .  allopurinol  (ZYLOPRIM) 100 MG tablet, Take 2 tablets (200 mg total) by mouth daily. .  celecoxib (CELEBREX) 200 MG capsule, One to 2 tablets by mouth daily as needed for pain.   Current Outpatient Medications (Other):  .  cholecalciferol (VITAMIN D) 1000 units tablet, Take 5,000 Units by mouth daily. Marland Kitchen  gabapentin (NEURONTIN) 100 MG capsule, Take 2 capsules (200 mg total) by mouth at bedtime. .  ranitidine (ZANTAC) 300 MG tablet, Take 300 mg by mouth at bedtime.   Reviewed prior external information including notes and imaging from  primary care provider As well as notes that were available from care everywhere and other healthcare systems.  Past medical history, social, surgical and family history all reviewed in electronic medical record.  No pertanent information unless stated regarding to the chief complaint.   Review of Systems:  No headache, visual changes, nausea, vomiting, diarrhea, constipation, dizziness, abdominal pain, skin rash, fevers, chills, night sweats, weight loss, swollen lymph nodes, body aches, joint swelling, chest pain, shortness of breath, mood changes. POSITIVE muscle aches  Objective  Blood pressure 120/80, pulse 64, height 6\' 4"  (1.93 m), weight 233 lb (105.7 kg), SpO2 94 %.   General: No apparent distress alert and oriented x3 mood and affect normal, dressed appropriately.  HEENT: Pupils equal, extraocular movements intact  Respiratory: Patient's speak in full sentences and does not appear short of breath  Cardiovascular: No lower extremity edema, non tender, no erythema  Neuro: Cranial nerves II through XII are intact, neurovascularly intact in all extremities with 2+ DTRs and 2+ pulses.  Gait normal with good balance and coordination.  MSK: Right knee exam still shows the patient does have some pain over the lateral joint line.  Very mild instability with varus force.  Full range of motion.  Significant decrease in inflammation from previous exam.    Impression and  Recommendations:     The above documentation has been reviewed and is accurate and complete Lyndal Pulley, DO       Note: This dictation was prepared with Dragon dictation along with smaller phrase technology. Any transcriptional errors that result from this process are unintentional.

## 2020-07-01 ENCOUNTER — Encounter: Payer: Self-pay | Admitting: Family Medicine

## 2020-08-26 ENCOUNTER — Ambulatory Visit: Payer: Medicare Other | Admitting: Family Medicine

## 2020-09-02 ENCOUNTER — Ambulatory Visit (INDEPENDENT_AMBULATORY_CARE_PROVIDER_SITE_OTHER): Payer: Medicare Other | Admitting: Family Medicine

## 2020-09-02 ENCOUNTER — Encounter: Payer: Self-pay | Admitting: Family Medicine

## 2020-09-02 ENCOUNTER — Other Ambulatory Visit: Payer: Self-pay

## 2020-09-02 ENCOUNTER — Ambulatory Visit: Payer: Self-pay

## 2020-09-02 VITALS — BP 102/72 | HR 67 | Ht 76.0 in | Wt 231.0 lb

## 2020-09-02 DIAGNOSIS — M25552 Pain in left hip: Secondary | ICD-10-CM

## 2020-09-02 DIAGNOSIS — M7062 Trochanteric bursitis, left hip: Secondary | ICD-10-CM | POA: Diagnosis not present

## 2020-09-02 NOTE — Progress Notes (Signed)
[name] [address] [place] [address] Phone: [phone] Subjective:   [name], am serving as a scribe for Dr. Antoine Primas. This visit occurred during the SARS-CoV-2 public health emergency.  Safety protocols were in place, including screening questions prior to the visit, additional usage of staff PPE, and extensive cleaning of exam room while observing appropriate contact time as indicated for disinfecting solutions.   I'm seeing this patient by the request  of:  Patient, No Pcp Per  CC: Left hip pain  [id number]   05/27/2020 Patient is doing remarkably well at this time.  Did need a repeat in 6 months.  We discussed at this point with him doing well continue the same regimen.  We discussed the potential discontinuation of allopurinol in the near future as well as the physical therapy.  Patient is going to have a follow-up appointment in 3 months for further evaluation.  Update 09/02/2020 Dent Plantz is a 69 y.o. male coming in with complaint of right knee and left hip pain. Patient did do PT for 7 weeks. Has tried to play tennis and pickleball. Feels like he is getting worse. Was using Celebrex for pain which helped the knee but not the left hip. Pain radiates down left leg to ankle. Has had GT injection Nov 2020 which helped to alleviate his pain. Was prescribed gabapentin but he did not use it as injection helped to alleviate pain. Unable to lie on left side at night.     Past Medical History:  Diagnosis Date  . Arthritis   . 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 GERD (gastroesophageal reflux disease)   . High blood pressure    Past Surgical History:  Procedure Laterality Date  . KNEE SURGERY Right 2005  . PROSTATE SURGERY  2015  . SHOULDER SURGERY Right 2009   Social History   Socioeconomic History  . Marital status: Married    Spouse name: Not on file  . Number of children: 2  . Years of  education: Not on file  . Highest education level: Not on file  Occupational History  . Not on file  Tobacco Use  . Smoking status: Not on file  Substance and Sexual Activity  . Alcohol use: Not on file  . Drug use: Not on file  . Sexual activity: Yes  Other Topics Concern  . Not on file  Social History Narrative  . Not on file   Social Determinants of Health   Financial Resource Strain:   . Difficulty of Paying Living Expenses: Not on file  Food Insecurity:   . Worried About Programme researcher, broadcasting/film/video in the Last Year: Not on file  . Ran Out of Food in the Last Year: Not on file  Transportation Needs:   . Lack of Transportation (Medical): Not on file  . Lack of Transportation (Non-Medical): Not on file  Physical Activity:   . Days of Exercise per Week: Not on file  . Minutes of Exercise per Session: Not on file  Stress:   . Feeling of Stress : Not on file  Social Connections:   . Frequency of Communication with Friends and Family: Not on file  . Frequency of Social Gatherings with Friends and Family: Not on file  . Attends Religious Services: Not on file  . Active Member of Clubs or Organizations: Not on file  . Attends Banker Meetings: Not on file  . Marital  Status: Not on file   Not on File Family History  Problem Relation Age of Onset  . Hearing loss Mother   . Hypertension Mother   . Diabetes Father   . Hearing loss Father   . Hypertension Father     Current Outpatient Medications (Endocrine & Metabolic):  .  predniSONE (DELTASONE) 50 MG tablet, Take 1 tablet (50 mg total) by mouth daily.  Current Outpatient Medications (Cardiovascular):  .  chlorthalidone (HYGROTON) 25 MG tablet, Take 12 mg by mouth daily. 12 mg daily .  flecainide (TAMBOCOR) 50 MG tablet, Take 50 mg by mouth 2 (two) times daily. .  metoprolol tartrate (LOPRESSOR) 50 MG tablet, Take 50 mg by mouth daily. Marland Kitchen  olmesartan (BENICAR) 40 MG tablet, Take 40 mg by mouth daily.   Current  Outpatient Medications (Analgesics):  .  allopurinol (ZYLOPRIM) 100 MG tablet, Take 2 tablets (200 mg total) by mouth daily. .  celecoxib (CELEBREX) 200 MG capsule, One to 2 tablets by mouth daily as needed for pain.   Current Outpatient Medications (Other):  .  cholecalciferol (VITAMIN D) 1000 units tablet, Take 5,000 Units by mouth daily. Marland Kitchen  gabapentin (NEURONTIN) 100 MG capsule, Take 2 capsules (200 mg total) by mouth at bedtime. .  ranitidine (ZANTAC) 300 MG tablet, Take 300 mg by mouth at bedtime.   Reviewed prior external information including notes and imaging from  primary care provider As well as notes that were available from care everywhere and other healthcare systems.  Past medical history, social, surgical and family history all reviewed in electronic medical record.  No pertanent information unless stated regarding to the chief complaint.   Review of Systems:  No headache, visual changes, nausea, vomiting, diarrhea, constipation, dizziness, abdominal pain, skin rash, fevers, chills, night sweats, weight loss, swollen lymph nodes, body aches, joint swelling, chest pain, shortness of breath, mood changes. POSITIVE muscle aches  Objective  Blood pressure 102/72, pulse 67, height 6\' 4"  (1.93 m), weight 231 lb (104.8 kg), SpO2 97 %.   General: No apparent distress alert and oriented x3 mood and affect normal, dressed appropriately.  HEENT: Pupils equal, extraocular movements intact  Respiratory: Patient's speak in full sentences and does not appear short of breath  Cardiovascular: No lower extremity edema, non tender, no erythema  Neuro: Cranial nerves II through XII are intact, neurovascularly intact in all extremities with 2+ DTRs and 2+ pulses.  Gait normal with good balance and coordination.  MSK: Left hip does have tenderness to palpation over the greater trochanteric area.  Minimal decreased range of internal and external range of motion.  Negative straight leg test.   Mild loss of lordosis of the lumbar spine.   Procedure: Real-time Ultrasound Guided Injection of left  greater trochanteric bursitis secondary to patient's body habitus Device: GE Logiq Q7  Ultrasound guided injection is preferred based studies that show increased duration, increased effect, greater accuracy, decreased procedural pain, increased response rate, and decreased cost with ultrasound guided versus blind injection.  Verbal informed consent obtained.  Time-out conducted.  Noted no overlying erythema, induration, or other signs of local infection.  Skin prepped in a sterile fashion.  Local anesthesia: Topical Ethyl chloride.  With sterile technique and under real time ultrasound guidance:  Greater trochanteric area was visualized and patient's bursa was noted. A 22-gauge 3 inch needle was inserted and 4 cc of 0.5% Marcaine and 1 cc of Kenalog 40 mg/dL was injected. Pictures taken Completed without difficulty  Pain immediately resolved  suggesting accurate placement of the medication.  Advised to call if fevers/chills, erythema, induration, drainage, or persistent bleeding.  Impression: Technically successful ultrasound guided injection.    Impression and Recommendations:     The above documentation has been reviewed and is accurate and complete Judi Saa, DO       Note: This dictation was prepared with Dragon dictation along with smaller phrase technology. Any transcriptional errors that result from this process are unintentional.

## 2020-09-02 NOTE — Assessment & Plan Note (Signed)
Chronic problem with mild exacerbation.  Discussed that the degenerative spinal stenosis could also be contributing.  Patient never took the gabapentin.  Patient had recently desponded well to the injection hopefully he will again.  Discussed icing regimen and home exercise, discussed which activities to do which wants to avoid.  Increase activity slowly.  Follow-up again in 4 to 8 weeks

## 2020-11-09 ENCOUNTER — Ambulatory Visit (INDEPENDENT_AMBULATORY_CARE_PROVIDER_SITE_OTHER): Payer: Medicare Other

## 2020-11-09 ENCOUNTER — Other Ambulatory Visit: Payer: Self-pay

## 2020-11-09 ENCOUNTER — Ambulatory Visit (INDEPENDENT_AMBULATORY_CARE_PROVIDER_SITE_OTHER): Payer: Medicare Other | Admitting: Family Medicine

## 2020-11-09 ENCOUNTER — Encounter: Payer: Self-pay | Admitting: Family Medicine

## 2020-11-09 VITALS — BP 114/82 | HR 71 | Ht 76.0 in | Wt 226.0 lb

## 2020-11-09 DIAGNOSIS — M545 Low back pain, unspecified: Secondary | ICD-10-CM

## 2020-11-09 DIAGNOSIS — M48061 Spinal stenosis, lumbar region without neurogenic claudication: Secondary | ICD-10-CM | POA: Diagnosis not present

## 2020-11-09 DIAGNOSIS — G8929 Other chronic pain: Secondary | ICD-10-CM

## 2020-11-09 DIAGNOSIS — M25552 Pain in left hip: Secondary | ICD-10-CM | POA: Diagnosis not present

## 2020-11-09 DIAGNOSIS — M25551 Pain in right hip: Secondary | ICD-10-CM

## 2020-11-09 NOTE — Assessment & Plan Note (Addendum)
Chronic problem with mild exacerbation history of a spinal stenosis.  Encourage patient to restart the gabapentin at night.  Discussed home exercises and work with Event organiser.  X-rays ordered today secondary to patient's other comorbidities.  Patient wants to continue to remain active and I think he should continue to do so.  Follow-up with me again 6 to 8 weeks

## 2020-11-09 NOTE — Progress Notes (Signed)
[facility] [address] [place] [address] Phone: [phone] Subjective:   Jay Morse, am serving as a scribe for Dr. Antoine Primas. This visit occurred during the SARS-CoV-2 public health emergency.  Safety protocols were in place, including screening questions prior to the visit, additional usage of staff PPE, and extensive cleaning of exam room while observing appropriate contact time as indicated for disinfecting solutions.   I'm seeing this patient by the request  of:  Patient, No Pcp Per  CC:  Low back pain follow-up   [id number]   09/02/2020 Chronic problem with mild exacerbation.  Discussed that the degenerative spinal stenosis could also be contributing.  Patient never took the gabapentin.  Patient had recently desponded well to the injection hopefully he will again.  Discussed icing regimen and home exercise, discussed which activities to do which wants to avoid.  Increase activity slowly.  Follow-up again in 4 to 8 weeks  Update 11/09/2020 Jay Morse is a 69 y.o. male coming in with complaint of left hip pain. Injected left GT last visit. Patient states that his hip is much better.  No pain at night like he use to have in the hip   Patient carried a kayak for a distance a week ago and his back pain increased for about 3 days. Did try Celebrex daily for past 8 days which is helping. Has been able to play tennis, walked, and pickleball. Feels like his quads are "mushy" and he is unable to climb up a hill on a bike. Patient feels that he is unable to endure long periods of exercise.  Patient denies any significant back pain other than this most recent injury.  Patient states that after he stops he seems to do well.  Notices that if he tries to push it to follow-up does not have the endurance to last long secondary to the leg pain.      Past Medical History:  Diagnosis Date  . Arthritis   . Cardiac arrhythmia due to congenital  heart disease   . GERD (gastroesophageal reflux disease)   . High blood pressure    Past Surgical History:  Procedure Laterality Date  . KNEE SURGERY Right 2005  . PROSTATE SURGERY  2015  . SHOULDER SURGERY Right 2009   Social History   Socioeconomic History  . Marital status: Married    Spouse name: Not on file  . Number of children: 2  . Years of education: Not on file  . Highest education level: Not on file  Occupational History  . Not on file  Tobacco Use  . Smoking status: Not on file  Substance and Sexual Activity  . Alcohol use: Not on file  . Drug use: Not on file  . Sexual activity: Yes  Other Topics Concern  . Not on file  Social History Narrative  . Not on file   Social Determinants of Health   Financial Resource Strain:   . Difficulty of Paying Living Expenses: Not on file  Food Insecurity:   . Worried About Programme researcher, broadcasting/film/video in the Last Year: Not on file  . Ran Out of Food in the Last Year: Not on file  Transportation Needs:   . Lack of Transportation (Medical): Not on file  . Lack of Transportation (Non-Medical): Not on file  Physical Activity:   . Days of Exercise per Week: Not on file  . Minutes of Exercise per Session: Not on file  Stress:   .  Feeling of Stress : Not on file  Social Connections:   . Frequency of Communication with Friends and Family: Not on file  . Frequency of Social Gatherings with Friends and Family: Not on file  . Attends Religious Services: Not on file  . Active Member of Clubs or Organizations: Not on file  . Attends Banker Meetings: Not on file  . Marital Status: Not on file   Not on File Family History  Problem Relation Age of Onset  . Hearing loss Mother   . Hypertension Mother   . Diabetes Father   . Hearing loss Father   . Hypertension Father     Current Outpatient Medications (Endocrine & Metabolic):  .  predniSONE (DELTASONE) 50 MG tablet, Take 1 tablet (50 mg total) by mouth  daily.  Current Outpatient Medications (Cardiovascular):  .  chlorthalidone (HYGROTON) 25 MG tablet, Take 12 mg by mouth daily. 12 mg daily .  flecainide (TAMBOCOR) 50 MG tablet, Take 50 mg by mouth 2 (two) times daily. .  metoprolol tartrate (LOPRESSOR) 50 MG tablet, Take 50 mg by mouth daily. Marland Kitchen  olmesartan (BENICAR) 40 MG tablet, Take 40 mg by mouth daily.   Current Outpatient Medications (Analgesics):  .  celecoxib (CELEBREX) 200 MG capsule, One to 2 tablets by mouth daily as needed for pain. Marland Kitchen  allopurinol (ZYLOPRIM) 100 MG tablet, Take 2 tablets (200 mg total) by mouth daily.   Current Outpatient Medications (Other):  .  cholecalciferol (VITAMIN D) 1000 units tablet, Take 5,000 Units by mouth daily. .  ranitidine (ZANTAC) 300 MG tablet, Take 300 mg by mouth at bedtime. .  gabapentin (NEURONTIN) 100 MG capsule, Take 2 capsules (200 mg total) by mouth at bedtime.   Reviewed prior external information including notes and imaging from  primary care provider As well as notes that were available from care everywhere and other healthcare systems.  Past medical history, social, surgical and family history all reviewed in electronic medical record.  No pertanent information unless stated regarding to the chief complaint.   Review of Systems:  No headache, visual changes, nausea, vomiting, diarrhea, constipation, dizziness, abdominal pain, skin rash, fevers, chills, night sweats, weight loss, swollen lymph nodes, , joint swelling, chest pain, shortness of breath, mood changes. POSITIVE muscle aches, mild body aches  Objective  Blood pressure 114/82, pulse 71, height 6\' 4"  (1.93 m), weight 226 lb (102.5 kg), SpO2 97 %.   General: No apparent distress alert and oriented x3 mood and affect normal, dressed appropriately.  HEENT: Pupils equal, extraocular movements intact  Respiratory: Patient's speak in full sentences and does not appear short of breath  Cardiovascular: No lower extremity  edema, non tender, no erythema  Gait normal with good balance and coordination.  MSK: Arthritic changes of multiple joints Low back exam mild loss of lordosis with some very mild degenerative scoliosis.  Tightness noted in the paraspinal musculature of the lumbar spine left greater than right.  This seems to be more of the L2-L3 area.  Mild tightness with , negative straight leg test.  Patient does have full strength 5 out of 5 of the lower extremities.    Impression and Recommendations:     The above documentation has been reviewed and is accurate and complete Pearlean Brownie, DO

## 2020-11-09 NOTE — Patient Instructions (Addendum)
When you see oncologist have them check CK level, LDH, Testosterone, ANA, Vit D, Iron panel  Xray on way out  Ice after activity  See me again in 6-8 weeks

## 2020-11-10 ENCOUNTER — Other Ambulatory Visit: Payer: Self-pay | Admitting: Family Medicine

## 2020-11-10 ENCOUNTER — Other Ambulatory Visit: Payer: Self-pay

## 2020-11-10 MED ORDER — CELECOXIB 200 MG PO CAPS: ORAL_CAPSULE | ORAL | 1 refills | Status: DC

## 2020-11-28 ENCOUNTER — Encounter: Payer: Self-pay | Admitting: Family Medicine

## 2021-03-15 NOTE — Progress Notes (Signed)
[facility] [address] [place] [address] Phone: [phone] Subjective:   Jay Morse, am serving as a scribe for Dr. Antoine Primas. This visit occurred during the SARS-CoV-2 public health emergency.  Safety protocols were in place, including screening questions prior to the visit, additional usage of staff PPE, and extensive cleaning of exam room while observing appropriate contact time as indicated for disinfecting solutions.   I'm seeing this patient by the request  of:  Patient, No Pcp Per (Inactive)  CC: Left shoulder pain  [id number]   11/09/2020 Chronic problem with mild exacerbation history of a spinal stenosis.  Encourage patient to restart the gabapentin at night.  Discussed home exercises and work with Event organiser.  X-rays ordered today secondary to patient's other comorbidities.  Patient wants to continue to remain active and I think he should continue to do so.  Follow-up with me again 6 to 8 weeks  Update 03/16/2021 Jay Morse is a 70 y.o. male coming in with complain of L shoulder pain. Patient kayaks and fishes. Patient reaches behind himself often to get supplies out of back of kayak. Pain occurring since January. Pain is posterior and lateral when extending arm. Patient states he has made some ergnomic modifications but pain continues.   Pain hips and knee pain has improved.   Xray 11/09/2020 Lumbar IMPRESSION: Multilevel disc degeneration and spurring. No acute abnormality. No change from 2019.   Xray 10/30/2020 pelvis IMPRESSION: No acute abnormality. Degenerative change in the hips bilaterally left greater than right.     Past Medical History:  Diagnosis Date  . Arthritis   . 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 GERD (gastroesophageal reflux disease)   . High blood pressure    Past Surgical History:  Procedure Laterality Date  . KNEE SURGERY Right 2005  . PROSTATE SURGERY  2015   . SHOULDER SURGERY Right 2009   Social History   Socioeconomic History  . Marital status: Married    Spouse name: Not on file  . Number of children: 2  . Years of education: Not on file  . Highest education level: Not on file  Occupational History  . Not on file  Tobacco Use  . Smoking status: Not on file  . Smokeless tobacco: Not on file  Substance and Sexual Activity  . Alcohol use: Not on file  . Drug use: Not on file  . Sexual activity: Yes  Other Topics Concern  . Not on file  Social History Narrative  . Not on file   Social Determinants of Health   Financial Resource Strain: Not on file  Food Insecurity: Not on file  Transportation Needs: Not on file  Physical Activity: Not on file  Stress: Not on file  Social Connections: Not on file   Not on File Family History  Problem Relation Age of Onset  . Hearing loss Mother   . Hypertension Mother   . Diabetes Father   . Hearing loss Father   . Hypertension Father     Current Outpatient Medications (Endocrine & Metabolic):  .  predniSONE (DELTASONE) 50 MG tablet, Take 1 tablet (50 mg total) by mouth daily.  Current Outpatient Medications (Cardiovascular):  .  chlorthalidone (HYGROTON) 25 MG tablet, Take 12 mg by mouth daily. 12 mg daily .  flecainide (TAMBOCOR) 50 MG tablet, Take 50 mg by mouth 2 (two) times daily. .  metoprolol tartrate (LOPRESSOR) 50 MG tablet, Take 50 mg  by mouth daily. Marland Kitchen  olmesartan (BENICAR) 40 MG tablet, Take 40 mg by mouth daily.   Current Outpatient Medications (Analgesics):  .  celecoxib (CELEBREX) 200 MG capsule, One tablet by mouth daily as needed for pain. Marland Kitchen  allopurinol (ZYLOPRIM) 100 MG tablet, Take 2 tablets (200 mg total) by mouth daily.   Current Outpatient Medications (Other):  .  cholecalciferol (VITAMIN D) 1000 units tablet, Take 5,000 Units by mouth daily. .  ranitidine (ZANTAC) 300 MG tablet, Take 300 mg by mouth at bedtime. .  gabapentin (NEURONTIN) 100 MG capsule,  Take 2 capsules (200 mg total) by mouth at bedtime.   Reviewed prior external information including notes and imaging from  primary care provider As well as notes that were available from care everywhere and other healthcare systems.  Past medical history, social, surgical and family history all reviewed in electronic medical record.  No pertanent information unless stated regarding to the chief complaint.   Review of Systems:  No headache, visual changes, nausea, vomiting, diarrhea, constipation, dizziness, abdominal pain, skin rash, fevers, chills, night sweats, weight loss, swollen lymph nodes, body aches, joint swelling, chest pain, shortness of breath, mood changes. POSITIVE muscle aches  Objective  Blood pressure 124/76, pulse (!) 59, height 6\' 4"  (1.93 m), weight 228 lb (103.4 kg), SpO2 97 %.   General: No apparent distress alert and oriented x3 mood and affect normal, dressed appropriately.  HEENT: Pupils equal, extraocular movements intact  Respiratory: Patient's speak in full sentences and does not appear short of breath  Cardiovascular: No lower extremity edema, non tender, no erythema  Gait normal with good balance and coordination.  MSK: Mild arthritic changes of multiple joints Left shoulder exam shows the patient does have weakness noted minorly with empty can with 4 out of 5 Morse compared to contralateral side.  Patient has significant difficulty with internal range of motion.  Positive Hawkins noted as well.  Positive O'Brien's noted but negative crossarm.  Limited musculoskeletal ultrasound was performed and interpreted by  Limited ultrasound of patient's left shoulder shows the patient has a severe bursitis with likely a hemarthrosis potentially in the area.  Cannot see the subscapularis tendon that appears to be intact but the supraspinatus is unable to be seen.  Patient does have arthritic changes noted of the acromioclavicular joint. Impression:  bursitis, questionable hemarthrosis with possible rotator cuff tear   Procedure: Real-time Ultrasound Guided Injection of left subacromial bursa Device: GE Logiq Q7 Ultrasound guided injection is preferred based studies that show increased duration, increased effect, greater accuracy, decreased procedural pain, increased response rate, and decreased cost with ultrasound guided versus blind injection.  Verbal informed consent obtained.  Time-out conducted.  Noted no overlying erythema, induration, or other signs of local infection.  Skin prepped in a sterile fashion.  Local anesthesia: Topical Ethyl chloride.  With sterile technique and under real time ultrasound guidance: With a 18-gauge 1-1/2 inch needle patient did have injection of 2 cc of 0.5% Marcaine but unable to aspirate secondary to thickness.  Then 1 cc of Kenalog 40 mg/mL used Pain immediately improved suggesting accurate placement of the medication.  Advised to call if fevers/chills, erythema, induration, drainage, or persistent bleeding.  Impression: Technically successful ultrasound guided injection.   Impression and Recommendations:     The above documentation has been reviewed and is accurate and complete Judi Saa, DO

## 2021-03-16 ENCOUNTER — Encounter: Payer: Self-pay | Admitting: Family Medicine

## 2021-03-16 ENCOUNTER — Ambulatory Visit (INDEPENDENT_AMBULATORY_CARE_PROVIDER_SITE_OTHER): Payer: Medicare Other | Admitting: Family Medicine

## 2021-03-16 ENCOUNTER — Ambulatory Visit (INDEPENDENT_AMBULATORY_CARE_PROVIDER_SITE_OTHER): Payer: Medicare Other

## 2021-03-16 ENCOUNTER — Other Ambulatory Visit: Payer: Self-pay

## 2021-03-16 ENCOUNTER — Ambulatory Visit: Payer: Self-pay

## 2021-03-16 VITALS — BP 124/76 | HR 59 | Ht 76.0 in | Wt 228.0 lb

## 2021-03-16 DIAGNOSIS — M25512 Pain in left shoulder: Secondary | ICD-10-CM

## 2021-03-16 DIAGNOSIS — M75102 Unspecified rotator cuff tear or rupture of left shoulder, not specified as traumatic: Secondary | ICD-10-CM | POA: Diagnosis not present

## 2021-03-16 NOTE — Patient Instructions (Addendum)
[facility] [phone] Ice 20 min 2x a day Hands in peripheral vision See me again in 6 weeks

## 2021-03-16 NOTE — Assessment & Plan Note (Signed)
Left shoulder has significant amount of swelling.  Unfortunately aspiration was not possible today.  Likely secondary to potential hemarthrosis.  Discussed with patient at great length.  Unable to see complete rotator cuff on ultrasound and do feel secondary to the weakness and the amount of swelling that advanced imaging is warranted.  Patient will get MR arthrogram to further evaluate.  Depending on findings we will discuss.  Hopefully it is a partial tearing of the potentially consider conservative therapy and then consider the possibility of other injections if necessary.  Follow-up again after advanced imaging.

## 2021-03-18 ENCOUNTER — Encounter: Payer: Self-pay | Admitting: Family Medicine

## 2021-03-30 NOTE — Progress Notes (Signed)
[name] [address] [place] [address] Phone: [phone] Subjective:   [name], am serving as a scribe for Dr. Antoine Primas. This visit occurred during the SARS-CoV-2 public health emergency.  Safety protocols were in place, including screening questions prior to the visit, additional usage of staff PPE, and extensive cleaning of exam room while observing appropriate contact time as indicated for disinfecting solutions.   I'm seeing this patient by the request  of:  Patient, No Pcp Per (Inactive)  CC: Left hip pain  [id number]   03/16/2021 Left shoulder has significant amount of swelling.  Unfortunately aspiration was not possible today.  Likely secondary to potential hemarthrosis.  Discussed with patient at great length.  Unable to see complete rotator cuff on ultrasound and do feel secondary to the weakness and the amount of swelling that advanced imaging is warranted.  Patient will get MR arthrogram to further evaluate.  Depending on findings we will discuss.  Hopefully it is a partial tearing of the potentially consider conservative therapy and then consider the possibility of other injections if necessary.  Follow-up again after advanced imaging.  Update 03/31/2021 Sajjad Honea is a 70 y.o. male coming in with complaint of left hip pain. MRI scheduled 5/3. Patient states he is here for hip pain today. Pain over greater trochanter. Went to [place] and played pickleball when he got back. Walked a disc golf course and has been in pain sine Friday. Pain radiates down to the L ankle. Took a gabapentin this morning. Pain 7/10.  Shoulder is significantly better. MRA scheduled on May 3rd.  Patient feels like his shoulder is doing better and is wanting to know if he should not continue to monitor.    Past Medical History:  Diagnosis Date  . Arthritis   . 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 GERD (gastroesophageal  reflux disease)   . High blood pressure    Past Surgical History:  Procedure Laterality Date  . KNEE SURGERY Right 2005  . PROSTATE SURGERY  2015  . SHOULDER SURGERY Right 2009   Social History   Socioeconomic History  . Marital status: Married    Spouse name: Not on file  . Number of children: 2  . Years of education: Not on file  . Highest education level: Not on file  Occupational History  . Not on file  Tobacco Use  . Smoking status: Not on file  . Smokeless tobacco: Not on file  Substance and Sexual Activity  . Alcohol use: Not on file  . Drug use: Not on file  . Sexual activity: Yes  Other Topics Concern  . Not on file  Social History Narrative  . Not on file   Social Determinants of Health   Financial Resource Strain: Not on file  Food Insecurity: Not on file  Transportation Needs: Not on file  Physical Activity: Not on file  Stress: Not on file  Social Connections: Not on file   Not on File Family History  Problem Relation Age of Onset  . Hearing loss Mother   . Hypertension Mother   . Diabetes Father   . Hearing loss Father   . Hypertension Father     Current Outpatient Medications (Endocrine & Metabolic):  .  predniSONE (DELTASONE) 50 MG tablet, Take 1 tablet (50 mg total) by mouth daily.  Current Outpatient Medications (Cardiovascular):  .  chlorthalidone (HYGROTON) 25 MG tablet, Take 12 mg by mouth  daily. 12 mg daily .  flecainide (TAMBOCOR) 50 MG tablet, Take 50 mg by mouth 2 (two) times daily. .  metoprolol tartrate (LOPRESSOR) 50 MG tablet, Take 50 mg by mouth daily. Marland Kitchen  olmesartan (BENICAR) 40 MG tablet, Take 40 mg by mouth daily.   Current Outpatient Medications (Analgesics):  .  celecoxib (CELEBREX) 200 MG capsule, One tablet by mouth daily as needed for pain. Marland Kitchen  allopurinol (ZYLOPRIM) 100 MG tablet, Take 2 tablets (200 mg total) by mouth daily.   Current Outpatient Medications (Other):  .  cholecalciferol (VITAMIN D) 1000 units  tablet, Take 5,000 Units by mouth daily. .  ranitidine (ZANTAC) 300 MG tablet, Take 300 mg by mouth at bedtime. .  gabapentin (NEURONTIN) 100 MG capsule, Take 2 capsules (200 mg total) by mouth at bedtime.   Reviewed prior external information including notes and imaging from  primary care provider As well as notes that were available from care everywhere and other healthcare systems.  Past medical history, social, surgical and family history all reviewed in electronic medical record.  No pertanent information unless stated regarding to the chief complaint.   Review of Systems:  No headache, visual changes, nausea, vomiting, diarrhea, constipation, dizziness, abdominal pain, skin rash, fevers, chills, night sweats, weight loss, swollen lymph nodes, body aches, joint swelling, chest pain, shortness of breath, mood changes. POSITIVE muscle aches  Objective  Blood pressure 138/86, pulse 63, height 6\' 4"  (1.93 m), weight 228 lb (103.4 kg), SpO2 99 %.   General: No apparent distress alert and oriented x3 mood and affect normal, dressed appropriately.  HEENT: Pupils equal, extraocular movements intact  Respiratory: Patient's speak in full sentences and does not appear short of breath  Cardiovascular: No lower extremity edema, non tender, no erythema  Gait mild antalgic favoring the left hip Left hip severely tender to palpation over the greater trochanteric area.  Straight leg test does not have any worsening radicular symptoms but does have numbness in the foot that patient states when he gets when he is driving.  Denies any significant back pain on exam today.  No significant weakness noted of the lower extremity.  Left shoulder exam shows significant improvement in strength from previous exam.  Still some mild positive impingement and mild positive O'Brien's still noted.   Procedure: Real-time Ultrasound Guided Injection of left  greater trochanteric bursitis secondary to patient's body  habitus Device: GE Logiq Q7  Ultrasound guided injection is preferred based studies that show increased duration, increased effect, greater accuracy, decreased procedural pain, increased response rate, and decreased cost with ultrasound guided versus blind injection.  Verbal informed consent obtained.  Time-out conducted.  Noted no overlying erythema, induration, or other signs of local infection.  Skin prepped in a sterile fashion.  Local anesthesia: Topical Ethyl chloride.  With sterile technique and under real time ultrasound guidance:  Greater trochanteric area was visualized and patient's bursa was noted. A 22-gauge 3 inch needle was inserted and 2 cc of 0.5% Marcaine and 1 cc of Kenalog 40 mg/dL was injected. Pictures taken Completed without difficulty  Pain immediately resolved suggesting accurate placement of the medication.  Advised to call if fevers/chills, erythema, induration, drainage, or persistent bleeding.  Impression: Technically successful ultrasound guided injection.    Impression and Recommendations:     The above documentation has been reviewed and is accurate and complete , DO

## 2021-03-31 ENCOUNTER — Ambulatory Visit: Payer: Self-pay

## 2021-03-31 ENCOUNTER — Encounter: Payer: Self-pay | Admitting: Family Medicine

## 2021-03-31 ENCOUNTER — Ambulatory Visit (INDEPENDENT_AMBULATORY_CARE_PROVIDER_SITE_OTHER): Payer: Medicare Other | Admitting: Family Medicine

## 2021-03-31 ENCOUNTER — Other Ambulatory Visit: Payer: Self-pay

## 2021-03-31 VITALS — BP 138/86 | HR 63 | Ht 76.0 in | Wt 228.0 lb

## 2021-03-31 DIAGNOSIS — M75102 Unspecified rotator cuff tear or rupture of left shoulder, not specified as traumatic: Secondary | ICD-10-CM | POA: Diagnosis not present

## 2021-03-31 DIAGNOSIS — M25552 Pain in left hip: Secondary | ICD-10-CM | POA: Diagnosis not present

## 2021-03-31 DIAGNOSIS — M7062 Trochanteric bursitis, left hip: Secondary | ICD-10-CM | POA: Diagnosis not present

## 2021-03-31 NOTE — Patient Instructions (Signed)
Injected hip today Push back MRI if you'd like closer to f/u See me as scheduled

## 2021-03-31 NOTE — Assessment & Plan Note (Signed)
Repeat injection given again today.  This seems to be a somewhat quicker.  Only 7 months since last injection.  We will continue to monitor.  We discussed that differential includes a potential lumbar radiculopathy and encouraged the potential for the gabapentin and to take that more regularly at night for now.  Discussed icing regimen.  Could do a short course of prednisone if necessary.  Follow-up with me again 4 weeks

## 2021-03-31 NOTE — Assessment & Plan Note (Signed)
Quickly did look with the ultrasound.  Patient still has some mild intrasubstance tearing noted but does not seem to have as much retraction results concerning previously.  Patient has significant decrease in the hypoechoic changes as well.  We discussed with patient about potentially trying the home exercises again for another 3 weeks but but have the MRI order just in case. Regular basis.  Follow-up with me again in 4 weeks

## 2021-04-04 ENCOUNTER — Encounter: Payer: Self-pay | Admitting: Family Medicine

## 2021-04-04 MED ORDER — PREDNISONE 50 MG PO TABS: 50.0000 mg | ORAL_TABLET | Freq: Every day | ORAL | 0 refills | Status: AC

## 2021-04-12 ENCOUNTER — Other Ambulatory Visit: Payer: Medicare Other

## 2021-04-26 ENCOUNTER — Ambulatory Visit: Payer: Medicare Other | Admitting: Family Medicine

## 2021-04-27 NOTE — Progress Notes (Signed)
[name] [address] [place] [address] Phone: [phone] Subjective:   Jay Morse, am serving as a scribe for Dr. Antoine Primas. This visit occurred during the SARS-CoV-2 public health emergency.  Safety protocols were in place, including screening questions prior to the visit, additional usage of staff PPE, and extensive cleaning of exam room while observing appropriate contact time as indicated for disinfecting solutions.   I'm seeing this patient by the request  of:  Patient, No Pcp Per (Inactive)  CC: Left shoulder pain follow-up  [id number]   03/31/2021 Quickly did look with the ultrasound.  Patient still has some mild intrasubstance tearing noted but does not seem to have as much retraction results concerning previously.  Patient has significant decrease in the hypoechoic changes as well.  We discussed with patient about potentially trying the home exercises again for another 3 weeks but but have the MRI order just in case. Regular basis.  Follow-up with me again in 4 weeks  Repeat injection given again today.  This seems to be a somewhat quicker.  Only 7 months since last injection.  We will continue to monitor.  We discussed that differential includes a potential lumbar radiculopathy and encouraged the potential for the gabapentin and to take that more regularly at night for now.  Discussed icing regimen.  Could do a short course of prednisone if necessary.  Follow-up with me again 4 weeks  Update 04/29/2021 Jay Morse is a 70 y.o. male coming in with complaint of L hip and L shoulder pain. Patient states that his shoulder continues to bother him. Reaching behind him still increases his pain.  Patient states it does not feel weak but does feel heavy.  Hip pain improved little with injection. Did take steroids and this helped to alleviate his pain. Start PT on Wednesday for his hip.   Stopped taking celebrex per cardiologist.         Past Medical History:  Diagnosis Date  . Arthritis   . 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 GERD (gastroesophageal reflux disease)   . High blood pressure    Past Surgical History:  Procedure Laterality Date  . KNEE SURGERY Right 2005  . PROSTATE SURGERY  2015  . SHOULDER SURGERY Right 2009   Social History   Socioeconomic History  . Marital status: Married    Spouse name: Not on file  . Number of children: 2  . Years of education: Not on file  . Highest education level: Not on file  Occupational History  . Not on file  Tobacco Use  . Smoking status: Not on file  . Smokeless tobacco: Not on file  Substance and Sexual Activity  . Alcohol use: Not on file  . Drug use: Not on file  . Sexual activity: Yes  Other Topics Concern  . Not on file  Social History Narrative  . Not on file   Social Determinants of Health   Financial Resource Strain: Not on file  Food Insecurity: Not on file  Transportation Needs: Not on file  Physical Activity: Not on file  Stress: Not on file  Social Connections: Not on file   Not on File Family History  Problem Relation Age of Onset  . Hearing loss Mother   . Hypertension Mother   . Diabetes Father   . Hearing loss Father   . Hypertension Father     Current Outpatient Medications (Endocrine & Metabolic):  .  predniSONE (DELTASONE) 50 MG tablet, Take 1 tablet (50 mg total) by mouth daily.  Current Outpatient Medications (Cardiovascular):  .  chlorthalidone (HYGROTON) 25 MG tablet, Take 12 mg by mouth daily. 12 mg daily .  flecainide (TAMBOCOR) 50 MG tablet, Take 50 mg by mouth 2 (two) times daily. .  metoprolol tartrate (LOPRESSOR) 50 MG tablet, Take 50 mg by mouth daily. Marland Kitchen  olmesartan (BENICAR) 40 MG tablet, Take 40 mg by mouth daily.   Current Outpatient Medications (Analgesics):  .  allopurinol (ZYLOPRIM) 100 MG tablet, Take 2 tablets (200 mg total) by mouth daily.   Current Outpatient  Medications (Other):  .  cholecalciferol (VITAMIN D) 1000 units tablet, Take 5,000 Units by mouth daily. .  ranitidine (ZANTAC) 300 MG tablet, Take 300 mg by mouth at bedtime. .  gabapentin (NEURONTIN) 100 MG capsule, Take 2 capsules (200 mg total) by mouth at bedtime.   Reviewed prior external information including notes and imaging from  primary care provider As well as notes that were available from care everywhere and other healthcare systems.  Past medical history, social, surgical and family history all reviewed in electronic medical record.  No pertanent information unless stated regarding to the chief complaint.   Review of Systems:  No headache, visual changes, nausea, vomiting, diarrhea, constipation, dizziness, abdominal pain, skin rash, fevers, chills, night sweats, weight loss, swollen lymph nodes, body aches, joint swelling, chest pain, shortness of breath, mood changes. POSITIVE muscle aches  Objective  Blood pressure 104/62, pulse 68, height 6\' 4"  (1.93 m), weight 224 lb (101.6 kg), SpO2 96 %.   General: No apparent distress alert and oriented x3 mood and affect normal, dressed appropriately.  HEENT: Pupils equal, extraocular movements intact  Respiratory: Patient's speak in full sentences and does not appear short of breath  Cardiovascular: No lower extremity edema, non tender, no erythema  Gait normal with good balance and coordination.  MSK: Exam focused on patient's left shoulder.  Patient still has some positive impingement noted.  Patient does have some difficulty with internal rotation.  Patient still has some what appears to be weakness after 90 degrees of flexion compared to the contralateral side.  Limited musculoskeletal ultrasound was performed and interpreted by  Limited ultrasound shows the patient does have bursitis noted.  Some questionable debris noted.  Patient does have what appears to be still a full-thickness tear noted with some  retraction at approximately 1 cm of the supraspinatus noted. Impression: Supraspinatus tear with retraction    Impression and Recommendations:     The above documentation has been reviewed and is accurate and complete Judi Saa, DO

## 2021-04-28 ENCOUNTER — Ambulatory Visit
Admission: RE | Admit: 2021-04-28 | Discharge: 2021-04-28 | Disposition: A | Discharge status: Home / Self Care | Payer: Medicare Other | Source: Ambulatory Visit | Attending: Family Medicine | Admitting: Family Medicine

## 2021-04-28 ENCOUNTER — Other Ambulatory Visit: Payer: Self-pay

## 2021-04-28 DIAGNOSIS — M25512 Pain in left shoulder: Secondary | ICD-10-CM

## 2021-04-28 MED ORDER — IOPAMIDOL (ISOVUE-M 200) INJECTION 41%
15.0000 mL | Freq: Once | INTRAMUSCULAR | Status: AC
  Administered 2021-04-28: 15 mL via INTRA_ARTICULAR

## 2021-04-29 ENCOUNTER — Ambulatory Visit (INDEPENDENT_AMBULATORY_CARE_PROVIDER_SITE_OTHER): Payer: Medicare Other | Admitting: Family Medicine

## 2021-04-29 ENCOUNTER — Telehealth: Payer: Self-pay | Admitting: Family Medicine

## 2021-04-29 ENCOUNTER — Ambulatory Visit: Payer: Self-pay

## 2021-04-29 ENCOUNTER — Encounter: Payer: Self-pay | Admitting: Family Medicine

## 2021-04-29 VITALS — BP 104/62 | HR 68 | Ht 76.0 in | Wt 224.0 lb

## 2021-04-29 DIAGNOSIS — M25512 Pain in left shoulder: Secondary | ICD-10-CM

## 2021-04-29 DIAGNOSIS — G8929 Other chronic pain: Secondary | ICD-10-CM | POA: Diagnosis not present

## 2021-04-29 DIAGNOSIS — M75102 Unspecified rotator cuff tear or rupture of left shoulder, not specified as traumatic: Secondary | ICD-10-CM | POA: Diagnosis not present

## 2021-04-29 NOTE — Telephone Encounter (Signed)
Patient called asking if you would be able to discuss the MRI that was mentioned during his visit on Friday, 04/29/2021. He said that after his visit, he found out more information? But you would know more about it.

## 2021-04-29 NOTE — Assessment & Plan Note (Signed)
Awaiting the read of the rotator cuff on MRI.  When I independently look at it there is some potential leakage from the diet.  Does appear that labrum is intact.  Is consistent with ultrasound findings are with patient having no retraction.  Depending on the amount of retraction this will tell us if patient is a candidate for PRP or do we need to consider the possibility of surgical intervention.  Discussed with patient icing regimen and home exercises.  Patient will continue this until we have the read and then we will discuss further when we have the full results.

## 2021-05-03 ENCOUNTER — Other Ambulatory Visit: Payer: Self-pay

## 2021-05-03 DIAGNOSIS — M75102 Unspecified rotator cuff tear or rupture of left shoulder, not specified as traumatic: Secondary | ICD-10-CM

## 2021-05-03 NOTE — Telephone Encounter (Signed)
Dr. Katrinka Blazing spoke with patient over the weekend.

## 2021-08-01 ENCOUNTER — Other Ambulatory Visit: Payer: Self-pay | Admitting: Family Medicine

## 2021-10-28 NOTE — Progress Notes (Signed)
[facility] [address] [place] [address] Phone: [phone] Subjective:   Jay Morse, am serving as a scribe for Dr. Antoine Primas. This visit occurred during the SARS-CoV-2 public health emergency.  Safety protocols were in place, including screening questions prior to the visit, additional usage of staff PPE, and extensive cleaning of exam room while observing appropriate contact time as indicated for disinfecting solutions.   I'm seeing this patient by the request  of:  Patient, No Pcp Per (Inactive)  CC: Right knee pain follow-up  [id number]  Jay Morse is a 70 y.o. male coming in with complaint of R knee pain. Patient last seen in May for shoulder pain. Patient states saw Dr. Katrinka Blazing about 3-4 years ago. Meniscal tear 18 years ago and got it repaired. Got injections. Last 6 months has gotten worse. Has not been able to play tennis or pickle ball. Pain on lateral side. Characterized as achy 3/4 out of 10. Going down stairs is painful. Stability is decreasing. Wants to talk interventions[place]      Past Medical History:  Diagnosis Date   Arthritis    Cardiac arrhythmia due to congenital heart disease    GERD (gastroesophageal reflux disease)    High blood pressure    Past Surgical History:  Procedure Laterality Date   KNEE SURGERY Right 2005   PROSTATE SURGERY  2015   SHOULDER SURGERY Right 2009   Social History   Socioeconomic History   Marital status: Married    Spouse name: Not on file   Number of children: 2   Years of education: Not on file   Highest education level: Not on file  Occupational History   Not on file  Tobacco Use   Smoking status: Not on file   Smokeless tobacco: Not on file  Substance and Sexual Activity   Alcohol use: Not on file   Drug use: Not on file   Sexual activity: Yes  Other Topics Concern   Not on file  Social History Narrative   Not on file    Social Determinants of Health   Financial Resource Strain: Not on file  Food Insecurity: Not on file  Transportation Needs: Not on file  Physical Activity: Not on file  Stress: Not on file  Social Connections: Not on file   Not on File Family History  Problem Relation Age of Onset   Hearing loss Mother    Hypertension Mother    Diabetes Father    Hearing loss Father    Hypertension Father     Current Outpatient Medications (Endocrine & Metabolic):    predniSONE (DELTASONE) 50 MG tablet, Take 1 tablet (50 mg total) by mouth daily.  Current Outpatient Medications (Cardiovascular):    chlorthalidone (HYGROTON) 25 MG tablet, Take 12 mg by mouth daily. 12 mg daily   flecainide (TAMBOCOR) 50 MG tablet, Take 50 mg by mouth 2 (two) times daily.   metoprolol tartrate (LOPRESSOR) 50 MG tablet, Take 50 mg by mouth daily.   olmesartan (BENICAR) 40 MG tablet, Take 40 mg by mouth daily.   Current Outpatient Medications (Analgesics):    allopurinol (ZYLOPRIM) 100 MG tablet, Take 2 tablets (200 mg total) by mouth daily.   celecoxib (CELEBREX) 200 MG capsule, TAKE 1 CAPSULE DAILY AS NEEDED FOR PAIN.   Current Outpatient Medications (Other):    cholecalciferol (VITAMIN D) 1000 units tablet, Take 5,000 Units by mouth daily.  gabapentin (NEURONTIN) 100 MG capsule, Take 2 capsules (200 mg total) by mouth at bedtime.   ranitidine (ZANTAC) 300 MG tablet, Take 300 mg by mouth at bedtime.   Reviewed prior external information including notes and imaging from  primary care provider As well as notes that were available from care everywhere and other healthcare systems.  Past medical history, social, surgical and family history all reviewed in electronic medical record.  No pertanent information unless stated regarding to the chief complaint.   Review of Systems:  No headache, visual changes, nausea, vomiting, diarrhea, constipation, dizziness, abdominal pain, skin rash, fevers, chills, night  sweats, weight loss, swollen lymph nodes, body aches, joint swelling, chest pain, shortness of breath, mood changes. POSITIVE muscle aches  Objective  Blood pressure 124/78, pulse 63, height 6\' 4"  (1.93 m), weight 231 lb (104.8 kg), SpO2 95 %.   General: No apparent distress alert and oriented x3 mood and affect normal, dressed appropriately.  HEENT: Pupils equal, extraocular movements intact  Respiratory: Patient's speak in full sentences and does not appear short of breath  Cardiovascular: No lower extremity edema, non tender, no erythema  Gait normal with good balance and coordination.  MSK: Right knee exam does have tenderness to palpation more over the lateral joint line and the patellofemoral joint.  He describes the pain as a dull, throbbing aching sensation.  Instability noted with valgus and varus force  After informed written and verbal consent, patient was seated on exam table. Right knee was prepped with alcohol swab and utilizing anterolateral approach, patient's right knee space was injected with 2:1  marcaine 0.5%: Kenalog 40mg /dL. Patient tolerated the procedure well without immediate complications.    Impression and Recommendations:     The above documentation has been reviewed and is accurate and complete , DO

## 2021-10-31 ENCOUNTER — Ambulatory Visit (INDEPENDENT_AMBULATORY_CARE_PROVIDER_SITE_OTHER): Payer: Medicare Other

## 2021-10-31 ENCOUNTER — Ambulatory Visit (INDEPENDENT_AMBULATORY_CARE_PROVIDER_SITE_OTHER): Payer: Medicare Other | Admitting: Family Medicine

## 2021-10-31 ENCOUNTER — Other Ambulatory Visit: Payer: Self-pay

## 2021-10-31 VITALS — BP 124/78 | HR 63 | Ht 76.0 in | Wt 231.0 lb

## 2021-10-31 DIAGNOSIS — M13861 Other specified arthritis, right knee: Secondary | ICD-10-CM | POA: Diagnosis not present

## 2021-10-31 NOTE — Assessment & Plan Note (Addendum)
Chronic problem with exacerbation.  Patient has done fairly remarkably well with the injections lasting greater than a year.  We discussed with patient about the possibility of viscosupplementation and we will hold on getting approval until next year.  Patient will continue to wear the brace.  Will consider the possibility of a partial knee replacement.  Discussed icing regimen and home exercises.  Follow-up with me again in 6 to 8 weeks.

## 2021-10-31 NOTE — Patient Instructions (Signed)
Xray today Injections today Send a message if you need [place] sooner Enjoy your snow birds days See you again in March

## 2022-01-08 ENCOUNTER — Encounter: Payer: Self-pay | Admitting: Family Medicine

## 2022-01-09 ENCOUNTER — Other Ambulatory Visit: Payer: Self-pay

## 2022-01-09 DIAGNOSIS — M13861 Other specified arthritis, right knee: Secondary | ICD-10-CM

## 2022-01-12 ENCOUNTER — Other Ambulatory Visit: Payer: Self-pay

## 2022-01-12 DIAGNOSIS — M7062 Trochanteric bursitis, left hip: Secondary | ICD-10-CM

## 2022-01-26 ENCOUNTER — Encounter: Payer: Self-pay | Admitting: Family Medicine

## 2022-03-14 ENCOUNTER — Ambulatory Visit: Payer: Medicare Other | Admitting: Family Medicine

## 2022-03-14 NOTE — Progress Notes (Signed)
?Jay Morse D.O. ?[facility] ?[address] [place] [address] ?Phone: [phone] ?Subjective:   ?I, Jay Morse, am serving as a scribe for Dr. Antoine Morse. ? ?This visit occurred during the SARS-CoV-2 public health emergency.  Safety protocols were in place, including screening questions prior to the visit, additional usage of staff PPE, and extensive cleaning of exam room while observing appropriate contact time as indicated for disinfecting solutions.  ? ?I'm seeing this patient by the request  of:  Patient, No Pcp Per (Inactive) ? ?CC: Right knee pain ? ?[id number]  ?10/31/2021 ?Chronic problem with exacerbation.  Patient has done fairly remarkably well with the injections lasting greater than a year.  We discussed with patient about the possibility of viscosupplementation and we will hold on getting approval until next year.  Patient will continue to wear the brace.  Will consider the possibility of a partial knee replacement.  Discussed icing regimen and home exercises.  Follow-up with me again in 6 to 8 weeks. ? ?Updated 03/15/2022 ?Jay Morse is a 71 y.o. male coming in with complaint of right knee pain.  Patient has had of the right knee pain for quite some time.  In November 2022 patient did have x-rays showing moderate degenerative tricompartmental arthritis. ? ?Concern that was more secondary to the left hip and the knee that was not getting better so MRIs were ordered.  MRI of the knee showed the patient did have moderate to severe arthritic changes especially of the lateral compartment with full-thickness cartilage loss as well as the patellofemoral joint almost have the same severity. ?Patient's left hip also had a fairly large degenerative labral tear but also moderate to severe arthritic changes noted.  Patient was given name of the orthopedic surgeons to potentially start to look into the possibility of surgical intervention.  Patient states that he no longer has pain  at rest.  ? ? ? ?  ? ?Past Medical History:  ?Diagnosis Date  ? Arthritis   ? Cardiac arrhythmia due to congenital heart disease   ? GERD (gastroesophageal reflux disease)   ? High blood pressure   ? ?Past Surgical History:  ?Procedure Laterality Date  ? KNEE SURGERY Right 2005  ? PROSTATE SURGERY  2015  ? SHOULDER SURGERY Right 2009  ? ?Social History  ? ?Socioeconomic History  ? Marital status: Married  ?  Spouse name: Not on file  ? Number of children: 2  ? Years of education: Not on file  ? Highest education level: Not on file  ?Occupational History  ? Not on file  ?Tobacco Use  ? Smoking status: Not on file  ? Smokeless tobacco: Not on file  ?Substance and Sexual Activity  ? Alcohol use: Not on file  ? Drug use: Not on file  ? Sexual activity: Yes  ?Other Topics Concern  ? Not on file  ?Social History Narrative  ? Not on file  ? ?Social Determinants of Health  ? ?Financial Resource Strain: Not on file  ?Food Insecurity: Not on file  ?Transportation Needs: Not on file  ?Physical Activity: Not on file  ?Stress: Not on file  ?Social Connections: Not on file  ? ?Not on File ?Family History  ?Problem Relation Age of Onset  ? Hearing loss Mother   ? Hypertension Mother   ? Diabetes Father   ? Hearing loss Father   ? Hypertension Father   ? ? ?Current Outpatient Medications (Endocrine & Metabolic):  ?  predniSONE (DELTASONE)  50 MG tablet, Take 1 tablet (50 mg total) by mouth daily. ? ?Current Outpatient Medications (Cardiovascular):  ?  chlorthalidone (HYGROTON) 25 MG tablet, Take 12 mg by mouth daily. 12 mg daily ?  flecainide (TAMBOCOR) 50 MG tablet, Take 50 mg by mouth 2 (two) times daily. ?  metoprolol tartrate (LOPRESSOR) 50 MG tablet, Take 50 mg by mouth daily. ?  olmesartan (BENICAR) 40 MG tablet, Take 40 mg by mouth daily. ? ? ?Current Outpatient Medications (Analgesics):  ?  celecoxib (CELEBREX) 200 MG capsule, TAKE 1 CAPSULE DAILY AS NEEDED FOR PAIN. ?  allopurinol (ZYLOPRIM) 100 MG tablet, Take 2 tablets  (200 mg total) by mouth daily. ? ? ?Current Outpatient Medications (Other):  ?  cholecalciferol (VITAMIN D) 1000 units tablet, Take 5,000 Units by mouth daily. ?  ranitidine (ZANTAC) 300 MG tablet, Take 300 mg by mouth at bedtime. ?  gabapentin (NEURONTIN) 100 MG capsule, Take 2 capsules (200 mg total) by mouth at bedtime. ? ? ? ?Objective  ?Blood pressure 116/78, pulse 71, height 6\' 4"  (1.93 m), weight 231 lb (104.8 kg). ?  ?General: No apparent distress alert and oriented x3 mood and affect normal, dressed appropriately.  ?HEENT: Pupils equal, extraocular movements intact  ?Respiratory: Patient's speak in full sentences and does not appear short of breath  ?Cardiovascular: No lower extremity edema, non tender, no erythema  ?Gait antalgic favoring the right knee minorly.  Patient is sitting comfortably no otherwise. ?  ?Impression and Recommendations:  ?  ? ?The above documentation has been reviewed and is accurate and complete , DO ? ? ? ?

## 2022-03-15 ENCOUNTER — Ambulatory Visit (INDEPENDENT_AMBULATORY_CARE_PROVIDER_SITE_OTHER): Payer: Medicare Other | Admitting: Family Medicine

## 2022-03-15 DIAGNOSIS — M1711 Unilateral primary osteoarthritis, right knee: Secondary | ICD-10-CM

## 2022-03-15 NOTE — Assessment & Plan Note (Signed)
Severe arthritic changes of the knee noted.  Discussed with patient about icing regimen and home exercise, was discussed with patient that I do think surgical intervention is the most likely.  Patient will be having ? ?In the future.  Follow-up with me as needed ?

## 2022-04-03 ENCOUNTER — Encounter: Payer: Self-pay | Admitting: Family Medicine

## 2022-07-12 IMAGING — DX DG SHOULDER 2+V*L*
3 series · 3 of 3 positions shown · non-contrast
Comparison: None.

CLINICAL DATA: 69-year-old male with left shoulder pain for 2
months.

EXAM:
LEFT SHOULDER - 2+ VIEW

[shoulder ap (1 of 2)]
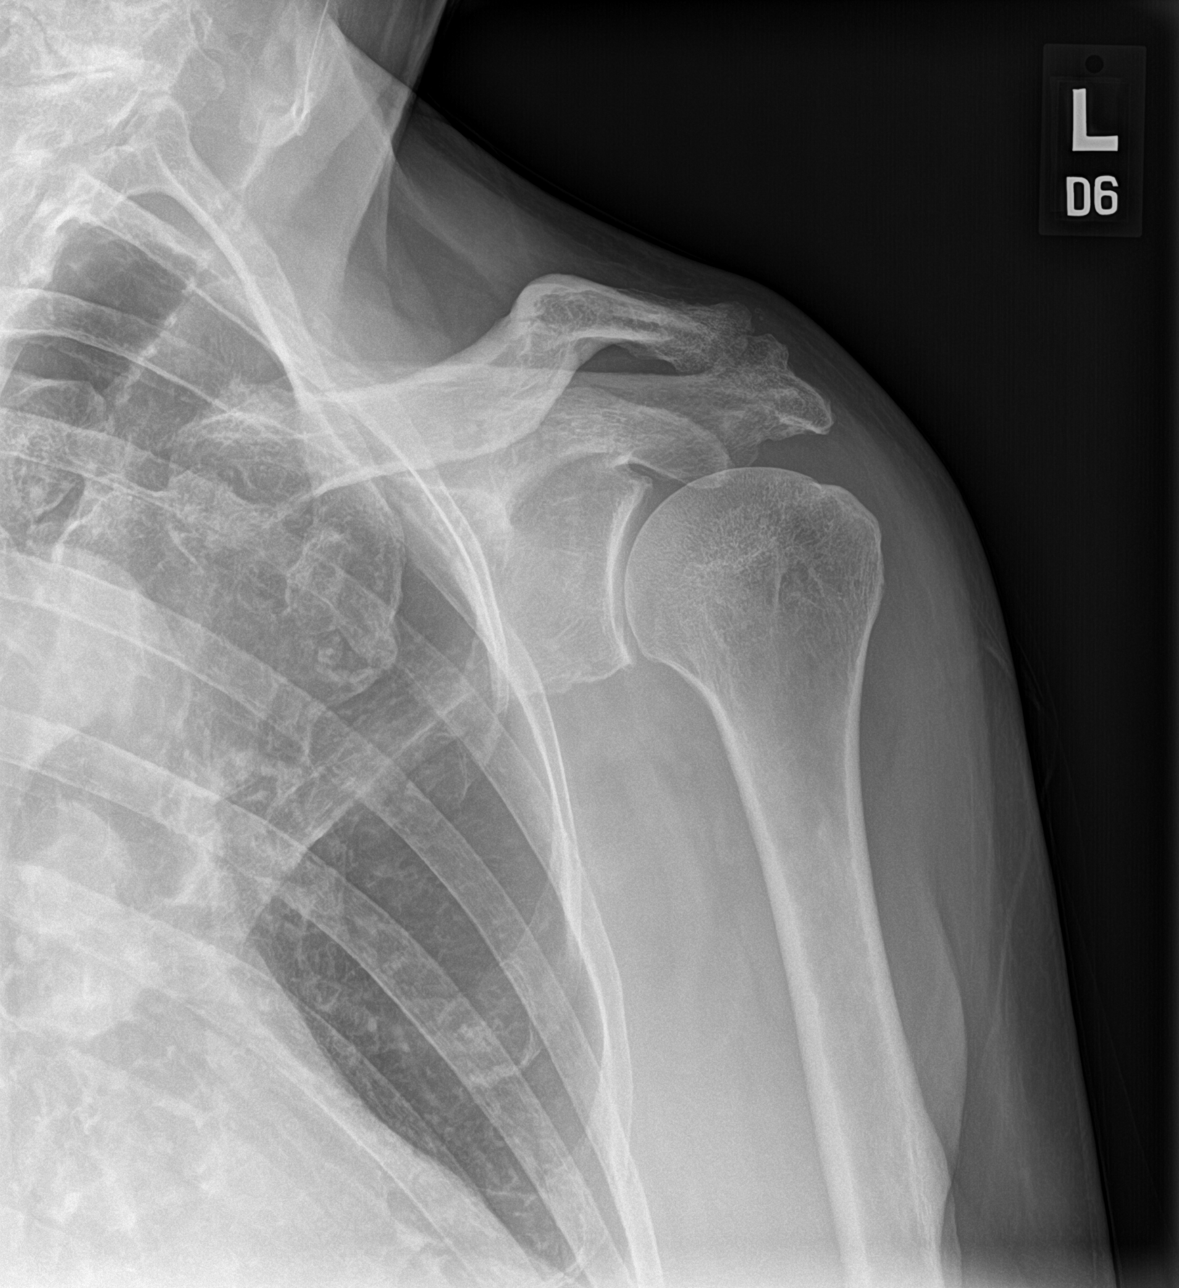

[shoulder ap (2 of 2)]
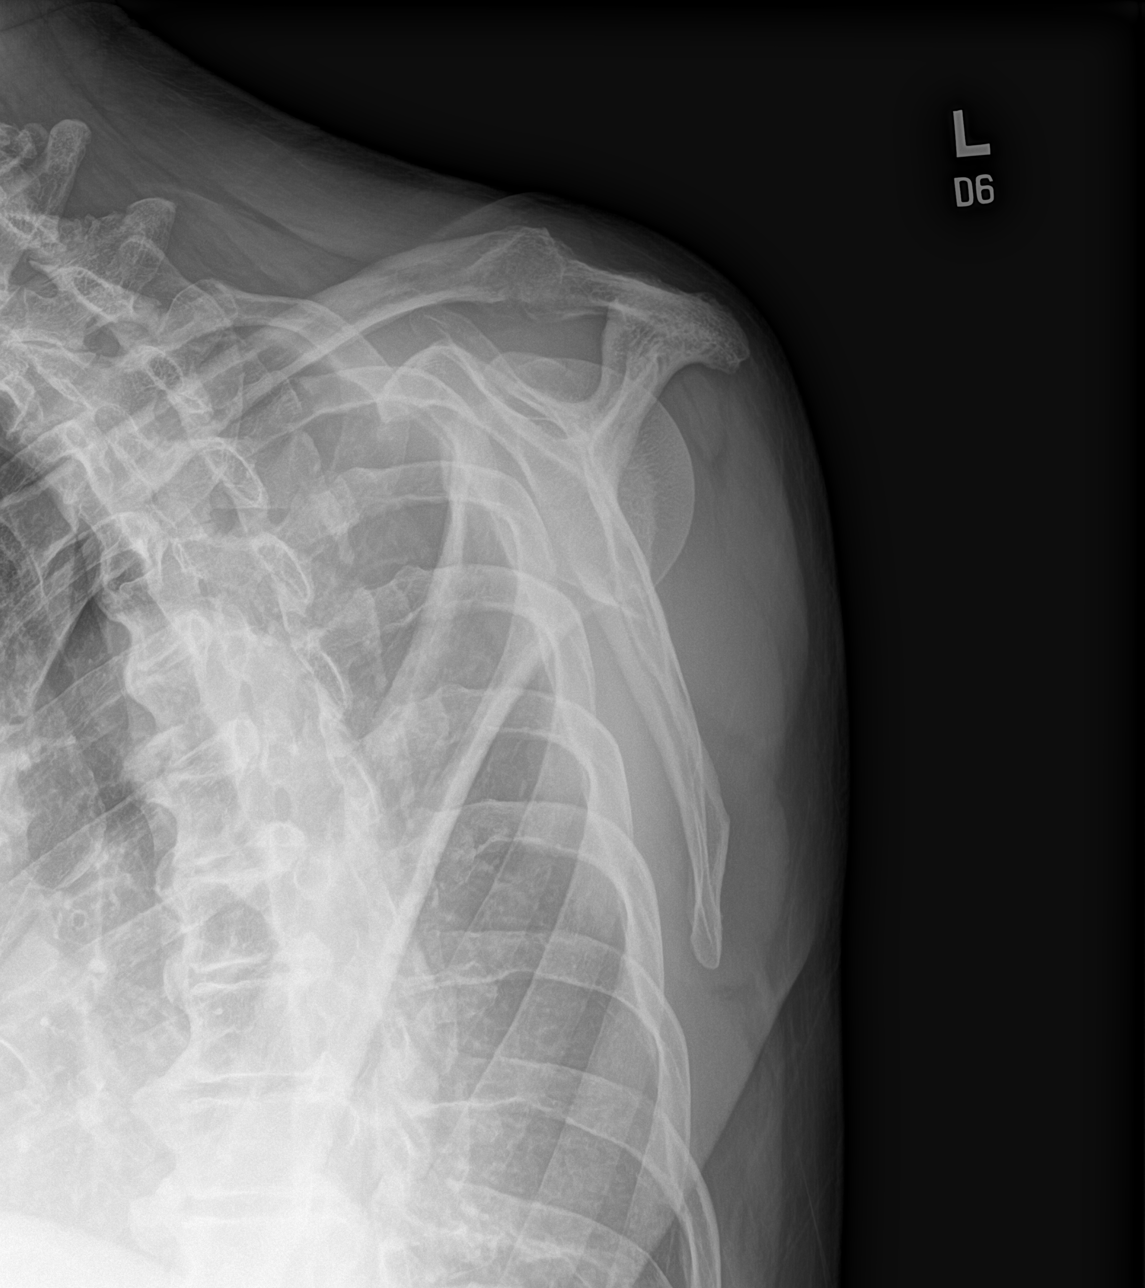

[shoulder axial]
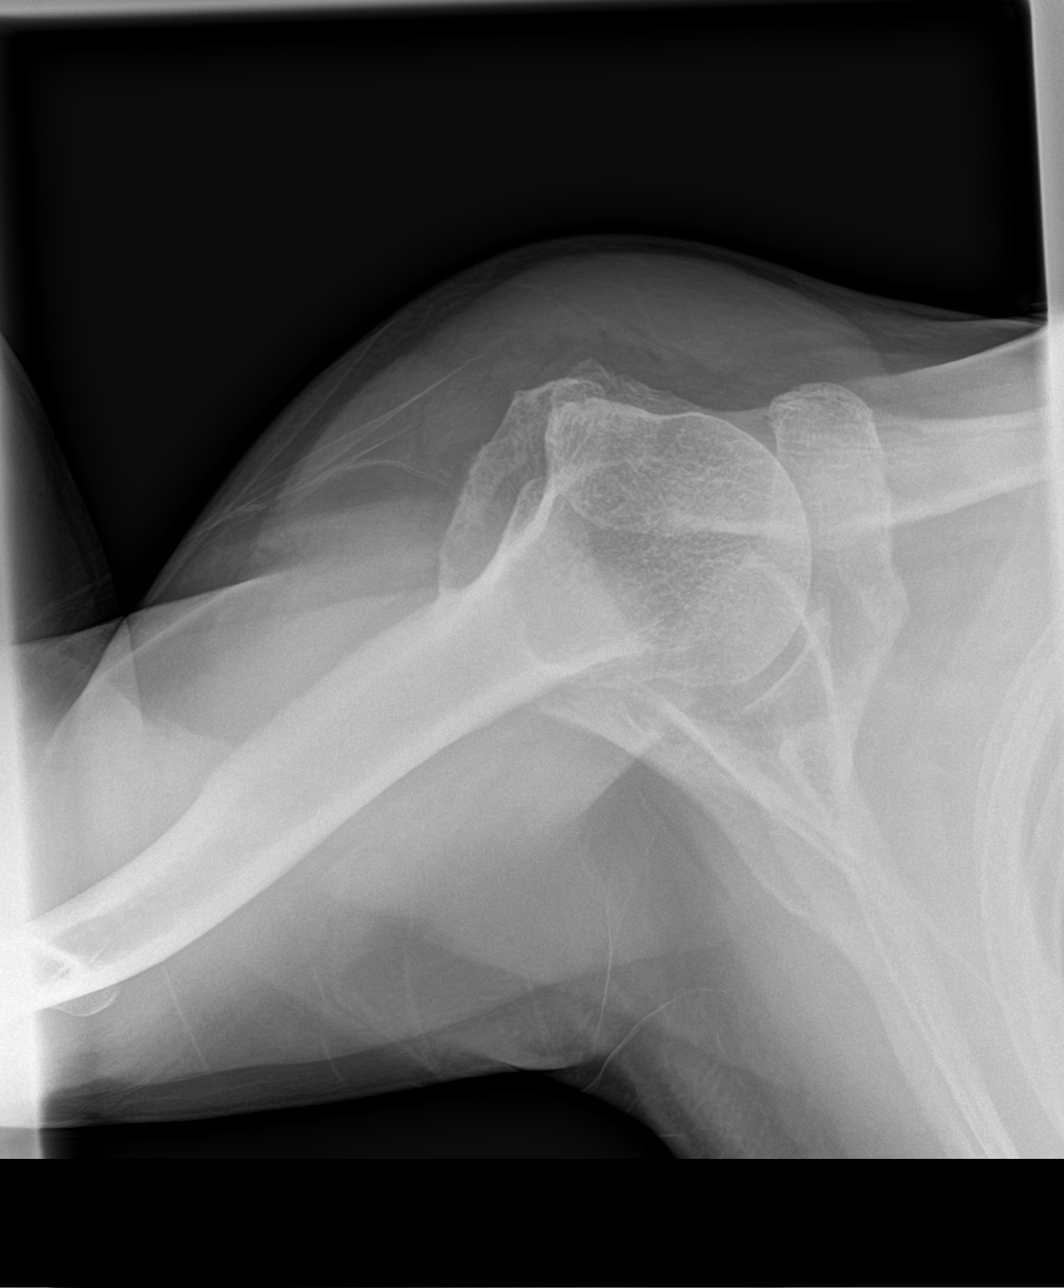

[3 of 3 positions shown; findings below may reference images not displayed]

FINDINGS: There is no evidence of fracture or dislocation. Mild
acromioclavicular joint degenerative change. The glenohumeral joint
is preserved. Soft tissues are unremarkable.
IMPRESSION: 1. No acute fracture or malalignment.
2. Mild acromioclavicular joint degenerative changes.

## 2022-08-03 ENCOUNTER — Encounter: Payer: Self-pay | Admitting: Family Medicine

## 2022-10-30 ENCOUNTER — Other Ambulatory Visit: Payer: Self-pay | Admitting: Family Medicine

## 2023-03-26 NOTE — Progress Notes (Unsigned)
[name] [address] [place] [address] Phone: [phone] Subjective:   [name], am serving as a scribe for Dr. Antoine Primas.  I'm seeing this patient by the request  of:  Patient, No Pcp Per  CC: back pain   [id number]  Last seen in April 2023 for knee pain  Updated 03/28/2023 Jay Morse is a 72 y.o. male coming in with complaint of back pain. LBP and some neck pain. In the AM unable to flex. Pain is limiting activities. Surgeries have helped and taking celebrex daily. Your recommendations have helped. Wants some simple solutions.     May 4th had knee replacement   Patient did have x-rays taken in November 2021 that were independently visualized by me showing the patient does have mild to moderate degenerative disc disease and some facet arthropathy of the lumbar spine  Past Medical History:  Diagnosis Date   Arthritis    Cardiac arrhythmia due to congenital heart disease    GERD (gastroesophageal reflux disease)    High blood pressure    Past Surgical History:  Procedure Laterality Date   KNEE SURGERY Right 2005   PROSTATE SURGERY  2015   SHOULDER SURGERY Right 2009   Social History   Socioeconomic History   Marital status: Married    Spouse name: Not on file   Number of children: 2   Years of education: Not on file   Highest education level: Not on file  Occupational History   Not on file  Tobacco Use   Smoking status: Not on file   Smokeless tobacco: Not on file  Substance and Sexual Activity   Alcohol use: Not on file   Drug use: Not on file   Sexual activity: Yes  Other Topics Concern   Not on file  Social History Narrative   Not on file   Social Determinants of Health   Financial Resource Strain: Not on file  Food Insecurity: Not on file  Transportation Needs: Not on file  Physical Activity: Not on file  Stress: Not on file  Social Connections: Not on file   Not on File Family History   Problem Relation Age of Onset   Hearing loss Mother    Hypertension Mother    Diabetes Father    Hearing loss Father    Hypertension Father     Current Outpatient Medications (Endocrine & Metabolic):    predniSONE (DELTASONE) 50 MG tablet, Take 1 tablet (50 mg total) by mouth daily.  Current Outpatient Medications (Cardiovascular):    chlorthalidone (HYGROTON) 25 MG tablet, Take 12 mg by mouth daily. 12 mg daily   flecainide (TAMBOCOR) 50 MG tablet, Take 50 mg by mouth 2 (two) times daily.   metoprolol tartrate (LOPRESSOR) 50 MG tablet, Take 50 mg by mouth daily.   olmesartan (BENICAR) 40 MG tablet, Take 40 mg by mouth daily.   Current Outpatient Medications (Analgesics):    allopurinol (ZYLOPRIM) 100 MG tablet, Take 2 tablets (200 mg total) by mouth daily.   celecoxib (CELEBREX) 200 MG capsule, TAKE 1 CAPSULE DAILY AS NEEDED FOR PAIN.   Current Outpatient Medications (Other):    cholecalciferol (VITAMIN D) 1000 units tablet, Take 5,000 Units by mouth daily.   gabapentin (NEURONTIN) 100 MG capsule, Take 2 capsules (200 mg total) by mouth at bedtime.   ranitidine (ZANTAC) 300 MG tablet, Take 300 mg by mouth at bedtime.   Reviewed prior external information including notes and imaging from  primary care provider As well as notes that were available from care everywhere and other healthcare systems.  Past medical history, social, surgical and family history all reviewed in electronic medical record.  No pertanent information unless stated regarding to the chief complaint.   Review of Systems:  No headache, visual changes, nausea, vomiting, diarrhea, constipation, dizziness, abdominal pain, skin rash, fevers, chills, night sweats, weight loss, swollen lymph nodes, body aches, joint swelling, chest pain, shortness of breath, mood changes. POSITIVE muscle aches  Objective  Blood pressure 122/82, pulse 66, height  (1.93 m), weight 227 lb (103 kg), SpO2 97 %.   General: No  apparent distress alert and oriented x3 mood and affect normal, dressed appropriately.  HEENT: Pupils equal, extraocular movements intact  Respiratory: Patient's speak in full sentences and does not appear short of breath  Cardiovascular: No lower extremity edema, non tender, no erythema  Low back exam does have some very mild loss of lordosis.  Still has some tightness with FABER test right greater than left.  Tightness with straight leg test but no radicular symptoms.  Neurovascularly intact distally.   [id number]; 15 additional minutes spent for Therapeutic exercises as stated in above notes.  This included exercises focusing on stretching, strengthening, with significant focus on eccentric aspects.   Long term goals include an improvement in range of motion, strength, endurance as well as avoiding reinjury. Patient's frequency would include in 1-2 times a day, 3-5 times a week for a duration of 6-12 weeks. Low back exercises that included:  Pelvic tilt/bracing instruction to focus on control of the pelvic girdle and lower abdominal muscles  Glute strengthening exercises, focusing on proper firing of the glutes without engaging the low back muscles Proper stretching techniques for maximum relief for the hamstrings, hip flexors, low back and some rotation where tolerated  Proper technique shown and discussed handout in great detail with ATC.  All questions were discussed and answered.     Impression and Recommendations:    The above documentation has been reviewed and is accurate and complete Judi Saa, DO

## 2023-03-28 ENCOUNTER — Encounter: Payer: Self-pay | Admitting: Family Medicine

## 2023-03-28 ENCOUNTER — Ambulatory Visit (INDEPENDENT_AMBULATORY_CARE_PROVIDER_SITE_OTHER): Payer: Medicare Other

## 2023-03-28 ENCOUNTER — Ambulatory Visit (INDEPENDENT_AMBULATORY_CARE_PROVIDER_SITE_OTHER): Payer: Medicare Other | Admitting: Family Medicine

## 2023-03-28 VITALS — BP 122/82 | HR 66 | Ht 76.0 in | Wt 227.0 lb

## 2023-03-28 DIAGNOSIS — M48061 Spinal stenosis, lumbar region without neurogenic claudication: Secondary | ICD-10-CM

## 2023-03-28 MED ORDER — CELECOXIB 200 MG PO CAPS: ORAL_CAPSULE | ORAL | 10 refills | Status: AC

## 2023-03-28 NOTE — Assessment & Plan Note (Signed)
Patient has known history of degenerative lumbar spinal stenosis.  Has had advanced imaging in 2020 as well as x-rays in 2021.  Patient is undergoing treatment for prostate cancer so we will get x-rays to further evaluate to make sure there is no bony abnormalities.  Patient has been following very carefully with his other providers.  Patient given new exercises for more isometrics for core strengthening and hip abductor strengthening today with athletic trainer.  We discussed different medications and refilled Celebrex the patient has that he can increase if needed especially with patient's laboratory workup being fairly unremarkable recently.  Discussed other potential things such as the stiffness and pain in the mornings that we could consider the possibility of a sleep study.  At this point patient would like to continue with conservative therapy and follow-up with me on an as-needed basis

## 2023-03-28 NOTE — Patient Instructions (Signed)
Do prescribed exercises at least 3x a week Xrays today Celebrex Keep [place] updated with MyChart in 4 weeks See [place] as needed
